# Patient Record
Sex: Male | Born: 1945 | Race: White | Hispanic: No | Marital: Single | Smoking: Former smoker
Health system: Southern US, Community
[De-identification: ages and names within clinical notes are randomized; demographics above are authoritative.]

## PROBLEM LIST (undated history)

## (undated) DIAGNOSIS — I4891 Unspecified atrial fibrillation: Secondary | ICD-10-CM

## (undated) DIAGNOSIS — G629 Polyneuropathy, unspecified: Secondary | ICD-10-CM

## (undated) DIAGNOSIS — M1990 Unspecified osteoarthritis, unspecified site: Secondary | ICD-10-CM

## (undated) DIAGNOSIS — M4800 Spinal stenosis, site unspecified: Secondary | ICD-10-CM

## (undated) DIAGNOSIS — E119 Type 2 diabetes mellitus without complications: Secondary | ICD-10-CM

## (undated) DIAGNOSIS — E669 Obesity, unspecified: Secondary | ICD-10-CM

## (undated) DIAGNOSIS — I10 Essential (primary) hypertension: Secondary | ICD-10-CM

## (undated) HISTORY — PX: BACK SURGERY: SHX140

---

## 2015-01-03 ENCOUNTER — Inpatient Hospital Stay
Admission: EM | Admit: 2015-01-03 | Discharge: 2015-01-05 | DRG: 638 | Disposition: A | Discharge status: Home / Self Care | Payer: Medicare Other | Attending: Specialist | Admitting: Specialist

## 2015-01-03 DIAGNOSIS — M4800 Spinal stenosis, site unspecified: Secondary | ICD-10-CM | POA: Diagnosis present

## 2015-01-03 DIAGNOSIS — M1990 Unspecified osteoarthritis, unspecified site: Secondary | ICD-10-CM | POA: Diagnosis present

## 2015-01-03 DIAGNOSIS — I10 Essential (primary) hypertension: Secondary | ICD-10-CM | POA: Diagnosis present

## 2015-01-03 DIAGNOSIS — E860 Dehydration: Secondary | ICD-10-CM | POA: Diagnosis present

## 2015-01-03 DIAGNOSIS — E1140 Type 2 diabetes mellitus with diabetic neuropathy, unspecified: Secondary | ICD-10-CM | POA: Diagnosis present

## 2015-01-03 DIAGNOSIS — R000 Tachycardia, unspecified: Secondary | ICD-10-CM | POA: Diagnosis present

## 2015-01-03 DIAGNOSIS — E1310 Other specified diabetes mellitus with ketoacidosis without coma: Principal | ICD-10-CM | POA: Diagnosis present

## 2015-01-03 DIAGNOSIS — E1110 Type 2 diabetes mellitus with ketoacidosis without coma: Secondary | ICD-10-CM | POA: Diagnosis present

## 2015-01-03 DIAGNOSIS — K219 Gastro-esophageal reflux disease without esophagitis: Secondary | ICD-10-CM | POA: Diagnosis present

## 2015-01-03 DIAGNOSIS — N179 Acute kidney failure, unspecified: Secondary | ICD-10-CM | POA: Diagnosis present

## 2015-01-03 DIAGNOSIS — Z79899 Other long term (current) drug therapy: Secondary | ICD-10-CM | POA: Diagnosis not present

## 2015-01-03 HISTORY — DX: Spinal stenosis, site unspecified: M48.00

## 2015-01-03 HISTORY — DX: Unspecified osteoarthritis, unspecified site: M19.90

## 2015-01-03 HISTORY — DX: Essential (primary) hypertension: I10

## 2015-01-03 HISTORY — DX: Polyneuropathy, unspecified: G62.9

## 2015-01-03 LAB — BASIC METABOLIC PANEL
Anion Gap: 21 — ABNORMAL HIGH (ref 7–16)
Anion Gap: 8 (ref 7–16)
BUN: 46 mg/dL — ABNORMAL HIGH
BUN: 35 mg/dL — ABNORMAL HIGH
CALCIUM: 8.7 mg/dL — AB
CO2: 20 mmol/L — AB
CREATININE: 1.1 mg/dL
Calcium, Total: 9.4 mg/dL
Chloride: 93 mmol/L — ABNORMAL LOW
Chloride: 111 mmol/L
Co2: 24 mmol/L
Creatinine: 1.85 mg/dL — ABNORMAL HIGH
EGFR (African American): >60
EGFR (Non-African Amer.): 37 — ABNORMAL LOW
GFR CALC AF AMER: 43 — AB
GLUCOSE: 1019 mg/dL — AB
Glucose: 314 mg/dL — ABNORMAL HIGH
POTASSIUM: 3.3 mmol/L — AB
Potassium: 4.8 mmol/L
Sodium: 134 mmol/L — ABNORMAL LOW
Sodium: 143 mmol/L

## 2015-01-03 LAB — CBC WITH DIFFERENTIAL/PLATELET
Basophil #: 0.1 10*3/uL (ref 0.0–0.1)
Basophil %: 0.3 %
EOS ABS: 0 10*3/uL (ref 0.0–0.7)
Eosinophil %: 0 %
HCT: 57.7 % — ABNORMAL HIGH (ref 40.0–52.0)
HGB: 18.3 g/dL — ABNORMAL HIGH (ref 13.0–18.0)
LYMPHS PCT: 5.3 %
Lymphocyte #: 1.4 10*3/uL (ref 1.0–3.6)
MCH: 29.4 pg (ref 26.0–34.0)
MCHC: 31.8 g/dL — AB (ref 32.0–36.0)
MCV: 93 fL (ref 80–100)
Monocyte #: 1.3 x10 3/mm — ABNORMAL HIGH (ref 0.2–1.0)
Monocyte %: 4.6 %
NEUTROS PCT: 89.8 %
Neutrophil #: 24.3 10*3/uL — ABNORMAL HIGH (ref 1.4–6.5)
Platelet: 249 10*3/uL (ref 150–440)
RBC: 6.24 10*6/uL — ABNORMAL HIGH (ref 4.40–5.90)
RDW: 14 % (ref 11.5–14.5)
WBC: 27.1 10*3/uL — AB (ref 3.8–10.6)

## 2015-01-03 LAB — URINALYSIS, COMPLETE
BACTERIA: NONE SEEN
BILIRUBIN, UR: NEGATIVE
Glucose,UR: >=500 mg/dL (ref 0–75)
Leukocyte Esterase: NEGATIVE
Nitrite: NEGATIVE
Ph: 5 (ref 4.5–8.0)
Protein: NEGATIVE
RBC,UR: NONE SEEN /HPF (ref 0–5)
Specific Gravity: 1.028 (ref 1.003–1.030)
Squamous Epithelial: NONE SEEN

## 2015-01-03 LAB — TROPONIN I: Troponin-I: 0.03 ng/mL

## 2015-01-03 LAB — MAGNESIUM: Magnesium: 2.8 mg/dL — ABNORMAL HIGH

## 2015-01-03 LAB — PHOSPHORUS: Phosphorus: 2.8 mg/dL

## 2015-01-03 LAB — POTASSIUM: POTASSIUM: 3.3 mmol/L — AB

## 2015-01-03 MED ORDER — MAGIC MOUTHWASH
10.0000 mL | Freq: Three times a day (TID) | ORAL | Status: DC
  Administered 2015-01-04 – 2015-01-05 (×3): 10 mL via ORAL
  Filled 2015-01-03 (×7): qty 10

## 2015-01-03 MED ORDER — GABAPENTIN 300 MG PO CAPS
900.0000 mg | ORAL_CAPSULE | Freq: Every day | ORAL | Status: DC
  Administered 2015-01-04: 900 mg via ORAL
  Filled 2015-01-03: qty 3

## 2015-01-03 MED ORDER — PANTOPRAZOLE SODIUM 40 MG IV SOLR
40.0000 mg | Freq: Two times a day (BID) | INTRAVENOUS | Status: DC
  Administered 2015-01-04: 40 mg via INTRAVENOUS
  Filled 2015-01-03 (×2): qty 40

## 2015-01-03 MED ORDER — SODIUM CHLORIDE 0.9 % IJ SOLN: 3.0000 mL | INTRAMUSCULAR | Status: DC | PRN

## 2015-01-03 MED ORDER — ASPIRIN EC 81 MG PO TBEC
81.0000 mg | DELAYED_RELEASE_TABLET | Freq: Every day | ORAL | Status: DC
  Administered 2015-01-04 – 2015-01-05 (×2): 81 mg via ORAL
  Filled 2015-01-03 (×2): qty 1

## 2015-01-03 MED ORDER — HEPARIN SODIUM (PORCINE) 5000 UNIT/ML IJ SOLN
5000.0000 [IU] | Freq: Three times a day (TID) | INTRAMUSCULAR | Status: DC
  Administered 2015-01-04 – 2015-01-05 (×4): 5000 [IU] via SUBCUTANEOUS
  Filled 2015-01-03 (×4): qty 1

## 2015-01-03 MED ORDER — SODIUM CHLORIDE 0.9 % IJ SOLN
3.0000 mL | Freq: Four times a day (QID) | INTRAMUSCULAR | Status: DC
  Administered 2015-01-04 – 2015-01-05 (×4): 3 mL via INTRAVENOUS

## 2015-01-03 MED ORDER — ACETAMINOPHEN 325 MG PO TABS
650.0000 mg | ORAL_TABLET | ORAL | Status: DC | PRN
Start: 2015-01-04 — End: 2015-01-05
  Administered 2015-01-04 – 2015-01-05 (×2): 650 mg via ORAL
  Filled 2015-01-03 (×2): qty 2

## 2015-01-03 MED ORDER — VITAMIN D 1000 UNITS PO TABS
1000.0000 [IU] | ORAL_TABLET | Freq: Every day | ORAL | Status: DC
  Administered 2015-01-04 – 2015-01-05 (×2): 1000 [IU] via ORAL
  Filled 2015-01-03 (×2): qty 1

## 2015-01-03 MED ORDER — CEFTRIAXONE SODIUM IN DEXTROSE 20 MG/ML IV SOLN
1.0000 g | INTRAVENOUS | Status: DC
  Administered 2015-01-04: 1 g via INTRAVENOUS
  Filled 2015-01-03 (×3): qty 50

## 2015-01-03 MED ORDER — SODIUM CHLORIDE 0.9 % IV SOLN: INTRAVENOUS | Status: DC

## 2015-01-03 MED ORDER — VITAMIN B-12 1000 MCG PO TABS
1000.0000 ug | ORAL_TABLET | Freq: Every day | ORAL | Status: DC
  Administered 2015-01-04 – 2015-01-05 (×2): 1000 ug via ORAL
  Filled 2015-01-03: qty 1

## 2015-01-03 MED ORDER — METOPROLOL TARTRATE 25 MG PO TABS
25.0000 mg | ORAL_TABLET | Freq: Two times a day (BID) | ORAL | Status: DC
  Administered 2015-01-04 (×2): 25 mg via ORAL
  Filled 2015-01-03 (×2): qty 1

## 2015-01-04 ENCOUNTER — Encounter: Payer: Self-pay | Admitting: Internal Medicine

## 2015-01-04 DIAGNOSIS — E1110 Type 2 diabetes mellitus with ketoacidosis without coma: Secondary | ICD-10-CM | POA: Diagnosis present

## 2015-01-04 DIAGNOSIS — N179 Acute kidney failure, unspecified: Secondary | ICD-10-CM | POA: Diagnosis present

## 2015-01-04 LAB — CBC
HCT: 47.4 % (ref 40.0–52.0)
Hemoglobin: 16.2 g/dL (ref 13.0–18.0)
MCH: 30.5 pg (ref 26.0–34.0)
MCHC: 34.3 g/dL (ref 32.0–36.0)
MCV: 89.1 fL (ref 80.0–100.0)
PLATELETS: 164 10*3/uL (ref 150–440)
RBC: 5.32 MIL/uL (ref 4.40–5.90)
RDW: 13.6 % (ref 11.5–14.5)
WBC: 16.8 10*3/uL — ABNORMAL HIGH (ref 3.8–10.6)

## 2015-01-04 LAB — BASIC METABOLIC PANEL
Anion Gap: 7 (ref 7–16)
BUN: 34 mg/dL — AB
CREATININE: 1.09 mg/dL
Calcium, Total: 8.5 mg/dL — ABNORMAL LOW
Chloride: 113 mmol/L — ABNORMAL HIGH
Co2: 26 mmol/L
EGFR (Non-African Amer.): >60
Glucose: 174 mg/dL — ABNORMAL HIGH
Potassium: 3.9 mmol/L
SODIUM: 146 mmol/L — AB

## 2015-01-04 LAB — GLUCOSE, CAPILLARY
GLUCOSE-CAPILLARY: 118 mg/dL — AB (ref 70–99)
GLUCOSE-CAPILLARY: 143 mg/dL — AB (ref 70–99)
GLUCOSE-CAPILLARY: 199 mg/dL — AB (ref 70–99)
GLUCOSE-CAPILLARY: 263 mg/dL — AB (ref 70–99)
GLUCOSE-CAPILLARY: 273 mg/dL — AB (ref 70–99)
Glucose-Capillary: 156 mg/dL — ABNORMAL HIGH (ref 70–99)
Glucose-Capillary: 180 mg/dL — ABNORMAL HIGH (ref 70–99)
Glucose-Capillary: 223 mg/dL — ABNORMAL HIGH (ref 70–99)
Glucose-Capillary: 261 mg/dL — ABNORMAL HIGH (ref 70–99)
Glucose-Capillary: 279 mg/dL — ABNORMAL HIGH (ref 70–99)
Glucose-Capillary: 248 mg/dL — ABNORMAL HIGH (ref 70–99)
Glucose-Capillary: 195 mg/dL — ABNORMAL HIGH (ref 70–99)
Glucose-Capillary: 237 mg/dL — ABNORMAL HIGH (ref 70–99)
Glucose-Capillary: 141 mg/dL — ABNORMAL HIGH (ref 70–99)
Glucose-Capillary: 313 mg/dL — ABNORMAL HIGH (ref 70–99)
Glucose-Capillary: 223 mg/dL — ABNORMAL HIGH (ref 70–99)
Glucose-Capillary: 220 mg/dL — ABNORMAL HIGH (ref 70–99)
Glucose-Capillary: 184 mg/dL — ABNORMAL HIGH (ref 70–99)

## 2015-01-04 LAB — COMPREHENSIVE METABOLIC PANEL
ALK PHOS: 77 U/L (ref 38–126)
ALT: 25 U/L (ref 17–63)
AST: 17 U/L (ref 15–41)
Albumin: 3.2 g/dL — ABNORMAL LOW (ref 3.5–5.0)
Anion gap: 7 (ref 5–15)
BILIRUBIN TOTAL: 0.7 mg/dL (ref 0.3–1.2)
BUN: 32 mg/dL — ABNORMAL HIGH (ref 6–20)
CO2: 24 mmol/L (ref 22–32)
CREATININE: 0.95 mg/dL (ref 0.61–1.24)
Calcium: 8.2 mg/dL — ABNORMAL LOW (ref 8.9–10.3)
Chloride: 114 mmol/L — ABNORMAL HIGH (ref 101–111)
GFR calc Af Amer: >60 mL/min (ref >60)
Glucose, Bld: 281 mg/dL — ABNORMAL HIGH (ref 65–99)
POTASSIUM: 3.9 mmol/L (ref 3.5–5.1)
SODIUM: 145 mmol/L (ref 135–145)
Total Protein: 5.9 g/dL — ABNORMAL LOW (ref 6.5–8.1)

## 2015-01-04 LAB — LIPID PANEL
CHOL/HDL RATIO: 7.1 ratio
Cholesterol: 178 mg/dL (ref 0–200)
HDL: 25 mg/dL — AB (ref >40)
LDL Cholesterol: 77 mg/dL (ref 0–99)
Triglycerides: 378 mg/dL — ABNORMAL HIGH (ref <150)
VLDL: 76 mg/dL — ABNORMAL HIGH (ref 0–40)

## 2015-01-04 LAB — HEMOGLOBIN A1C: Hemoglobin A1C: 10.8 % — ABNORMAL HIGH

## 2015-01-04 LAB — URINE CULTURE

## 2015-01-04 MED ORDER — INSULIN REGULAR HUMAN 100 UNIT/ML IJ SOLN
INTRAMUSCULAR | Status: DC
  Administered 2015-01-04: 4 [IU]/h via INTRAVENOUS
  Filled 2015-01-04: qty 2.5

## 2015-01-04 MED ORDER — INSULIN ASPART 100 UNIT/ML ~~LOC~~ SOLN
10.0000 [IU] | Freq: Three times a day (TID) | SUBCUTANEOUS | Status: DC
  Administered 2015-01-04 – 2015-01-05 (×5): 10 [IU] via SUBCUTANEOUS
  Filled 2015-01-04 (×5): qty 10

## 2015-01-04 MED ORDER — SODIUM CHLORIDE 0.9 % IV SOLN
INTRAVENOUS | Status: DC
  Administered 2015-01-04 – 2015-01-05 (×4): via INTRAVENOUS

## 2015-01-04 MED ORDER — DEXTROSE-NACL 5-0.9 % IV SOLN
INTRAVENOUS | Status: DC
  Administered 2015-01-04 (×2): via INTRAVENOUS

## 2015-01-04 MED ORDER — LIVING WELL WITH DIABETES BOOK
Freq: Once | Status: AC
  Administered 2015-01-04: 12:00:00
  Filled 2015-01-04: qty 1

## 2015-01-04 MED ORDER — INSULIN DETEMIR 100 UNIT/ML ~~LOC~~ SOLN
25.0000 [IU] | Freq: Every day | SUBCUTANEOUS | Status: DC
  Filled 2015-01-04: qty 0.25

## 2015-01-04 MED ORDER — INSULIN ASPART 100 UNIT/ML ~~LOC~~ SOLN
0.0000 [IU] | SUBCUTANEOUS | Status: DC
  Administered 2015-01-04: 2 [IU] via SUBCUTANEOUS
  Administered 2015-01-04: 6 [IU] via SUBCUTANEOUS
  Administered 2015-01-04: 8 [IU] via SUBCUTANEOUS
  Administered 2015-01-05: 2 [IU] via SUBCUTANEOUS
  Administered 2015-01-05: 4 [IU] via SUBCUTANEOUS
  Administered 2015-01-05: 2 [IU] via SUBCUTANEOUS
  Filled 2015-01-04: qty 8
  Filled 2015-01-04: qty 4
  Filled 2015-01-04: qty 2
  Filled 2015-01-04: qty 6
  Filled 2015-01-04 (×2): qty 2

## 2015-01-04 MED ORDER — INSULIN DETEMIR 100 UNIT/ML ~~LOC~~ SOLN
25.0000 [IU] | SUBCUTANEOUS | Status: AC
  Administered 2015-01-04: 25 [IU] via SUBCUTANEOUS
  Filled 2015-01-04: qty 0.25

## 2015-01-04 MED ORDER — PANTOPRAZOLE SODIUM 40 MG PO TBEC
40.0000 mg | DELAYED_RELEASE_TABLET | Freq: Two times a day (BID) | ORAL | Status: DC
  Administered 2015-01-04 – 2015-01-05 (×2): 40 mg via ORAL
  Filled 2015-01-04 (×2): qty 1

## 2015-01-04 NOTE — Plan of Care (Signed)
Problem: Consults Goal: Diagnosis-Diabetes Mellitus Outcome: Adequate for Discharge New Onset Type II

## 2015-01-04 NOTE — Progress Notes (Signed)
Initial Nutrition Assessment  DOCUMENTATION CODES:     INTERVENTION:  Nutrition related diet education: Discussed carb counting, meal planning and label reading with pt (sleeping) and significant other at bedside.  Significant other verbalized understanding, expect fair compliance.  NUTRITION DIAGNOSIS:  Food and nutrition related knowledge deficit related to new diagnosis of DM as evidenced by consult for diet education.    GOAL:  Patient will meet greater than or equal to 90% of their needs    MONITOR:  Energy intake Labs  REASON FOR ASSESSMENT:  Consult Diet education  ASSESSMENT:  Pt admitted with DKA, ARF, new diagnosis of DM PMhx: spinal stenosis, HTN, osteoarthritis, GERD  Height:  Ht Readings from Last 1 Encounters:  01/03/15 6' 3.98" (1.93 m)    Weight:  Wt Readings from Last 1 Encounters:  01/03/15 260 lb (117.935 kg)    Ideal Body Weight:     Wt Readings from Last 10 Encounters:  01/03/15 260 lb (117.935 kg)    BMI:  Body mass index is 31.66 kg/(m^2).  Estimated Nutritional Needs:  Kcal:     Protein:     Fluid:     Skin:     Diet Order:  Diet Carb Modified Fluid consistency:: Thin; Room service appropriate?: Yes  EDUCATION NEEDS:  Education needs addressed   Intake/Output Summary (Last 24 hours) at 01/04/15 1309 Last data filed at 01/04/15 0800  Gross per 24 hour  Intake  946.6 ml  Output    300 ml  Net  646.6 ml    Last BM:  4/30  Low care level

## 2015-01-04 NOTE — Progress Notes (Signed)
Transfer from CCU.  Vitals remained stable this shift.  Pt had no complaints of pain.  Patients CBG are trending down.

## 2015-01-04 NOTE — Progress Notes (Signed)
Patient is receiving Protonix by the IV route.  Pt meets the P & T approved criteria for changing to oral administration.  - No GI bleeding  - Tolerating an oral or per tube diet  - Taking other oral or per tube medications.  Will change patient to Protonix 40mg  PO BID per P&T policy.  Thank you. Toys 'R' UsKimberly Finnian Husted, Pharm.D., BCPS Clinical Pharmacist Pager [phone]

## 2015-01-04 NOTE — Progress Notes (Signed)
Patient is alert and oriented. Reporting no pain. Converted from insulin drip this am, tolerating subq insulin injections and tolerating diet. Room air with oxygen saturation WNL. Voiding in urinal. Transfer to room 136. Report given to [place]. Transporting at this time.

## 2015-01-04 NOTE — Progress Notes (Signed)
Patient Demographics  Steven BeaversRobert Gallegos, is a 69 y.o. male, DOB - May 12, 1946, NFA:[id number]  Admit date - 01/03/2015   Admitting Physician Marguarite ArbourJeffrey D Sparks, MD  Outpatient Primary MD for the patient is No primary care provider on file.        Subjective:   Steven Beaversobert Moorer today has No chest pain, No abdominal pain, No Nausea/vomiting, No Cough     CONSTITUTIONAL: No documented fever. No fatigue, weakness. No weight gain, no weight loss.  ENT: No tinnitus. No postnasal drip. No redness of the oropharynx.  RESPIRATORY: No cough, no wheeze, no hemoptysis. No dyspnea.  CARDIOVASCULAR: No chest pain. No orthopnea. No palpitations. No syncope.  GASTROINTESTINAL: No nausea, no vomiting or diarrhea. No abdominal pain. No melena or hematochezia.  GENITOURINARY: No dysuria or hematuria.  ENDOCRINE: No polyuria or nocturia. No heat or cold intolerance.  MUSCULOSKELETAL: No arthritis. No swelling. No gout.  NEUROLOGIC: No numbness, tingling, or ataxia. No seizure-type activity.  PSYCHIATRIC: No anxiety. No insomnia. No ADD.      Objective:   Filed Vitals:   01/04/15 0540 01/04/15 0600 01/04/15 0700 01/04/15 0800  BP: 126/98 130/92 145/89 135/91  Pulse: 89 89 87 87  Temp:      TempSrc:      Resp: 21 21 21 19   Height:      Weight:      SpO2: 99% 97% 97% 95%    Wt Readings from Last 3 Encounters:  01/03/15 117.935 kg (260 lb)     Intake/Output Summary (Last 24 hours) at 01/04/15 0941 Last data filed at 01/04/15 0800  Gross per 24 hour  Intake  946.6 ml  Output    300 ml  Net  646.6 ml      Physical Exam   Gen - Awake Alert, Oriented X 3 HEENT - AT, [place],PERRL, moist oral mucosa.   Neck - Supple Neck,No JVD, No Lymphadenopathy, no thyromegaly.  Trachea midline.  Lung - CTA b/l, no rales, rhonchi, wheezes.   Heart - RRR,No Gallops,Rubs or new Murmurs, No Parasternal Heave Abg - +ve B.Sounds, Abd Soft, No tenderness, No organomegaly appriciated, No rebound - guarding or  rigidity. Ext - No Cyanosis, Clubbing or peripheral edema, No new Rashes or bruising.   Neuro - AAO X 3, no focal motor or sensory defecits b/l  Data Review   Micro Results No results found for this or any previous visit (from the past 240 hour(s)).  Radiology Reports Dg Chest Port 1 View  01/03/2015   CLINICAL DATA:  Several day history of weakness and dizziness  EXAM: PORTABLE CHEST - 1 VIEW  COMPARISON:  None.  FINDINGS: Lungs are clear. Heart size and pulmonary vascularity are normal. No adenopathy. No bone lesions.  IMPRESSION: No edema or consolidation.   Electronically Signed   By: Bretta BangWilliam  Woodruff III M.D.   On: 01/03/2015 13:57    CBC  Recent Labs Lab 01/03/15 1005 01/04/15 0404  WBC 27.1* 16.8*  HGB 18.3* 16.2  HCT 57.7* 47.4  PLT 249 164  MCV 93 89.1  MCH 29.4 30.5  MCHC 31.8* 34.3  RDW 14.0 13.6  LYMPHSABS 1.4  --   MONOABS 1.3*  --   EOSABS 0.0  --   BASOSABS 0.1  --     Chemistries   Recent Labs Lab 01/03/15 1005 01/03/15 1926 01/03/15 2354 01/04/15 0404  NA  --   --   --  145  K  --   --   --  3.9  CL  --   --   --  114*  CO2 20* GLUCOSE  --   --   --  281*  BUN 46* 35* 34* 32*  CREATININE 1.85* 1.10 1.09 0.95  CALCIUM 9.4 8.7* 8.5* 8.2*  AST  --   --   --  17  ALT  --   --   --  25  ALKPHOS  --   --   --  77  BILITOT  --   --   --  0.7   ------------------------------------------------------------------------------------------------------------------ estimated creatinine clearance is 105.9 mL/min (by C-G formula based on Cr of 0.95). ------------------------------------------------------------------------------------------------------------------ No results for input(s): HGBA1C in the last 72 hours. ------------------------------------------------------------------------------------------------------------------ No results for input(s): CHOL, HDL, LDLCALC, TRIG, CHOLHDL, LDLDIRECT in the last 72  hours. ------------------------------------------------------------------------------------------------------------------ No results for input(s): TSH, T4TOTAL, T3FREE, THYROIDAB in the last 72 hours.  Invalid input(s): FREET3 ------------------------------------------------------------------------------------------------------------------ No results for input(s): VITAMINB12, FOLATE, FERRITIN, TIBC, IRON, RETICCTPCT in the last 72 hours.  Coagulation profile No results for input(s): INR, PROTIME in the last 168 hours.  No results for input(s): DDIMER in the last 72 hours.  Cardiac Enzymes No results for input(s): CKMB, TROPONINI, MYOGLOBIN in the last 168 hours.  Invalid input(s): CK ------------------------------------------------------------------------------------------------------------------ Invalid input(s): POCBNP       Assessment & Plan   Active Problems:    1. Diabetic ketoacidosis - resolved now and BS stable and AG closed.  - off insulin gtt and started on Levemir, Novolog w/ meals and will monitor.  - HemoglobinA1c is pending.   - No N/V and tolerating PO well.   2. Acute renal failure - likely due to dehydration and hyperglycemia.  - improved and resolved as BS have corrected.   3. Tachycardia - likely due to dehydration, DKA.  - resolved now as DKA has resolved.   4. Leukocytosis - likely stress mediated from DKA - no evidence of infection. WBC count trending donw.  - cont. Empiric Ceftriaxone and will d/c in a.m. If WBC count stable.   5. DM Neuropathy - cont. Cont. Neurontin.   6. Htn - cont. Metoprolol  7. GERD - cont. Protonix.       Medications  Scheduled Meds: . aspirin EC  81 mg Oral Daily  . cefTRIAXone (ROCEPHIN)  IV  1 g Intravenous Q24H  . cholecalciferol  1,000 Units Oral Daily  . gabapentin  900 mg Oral QHS  . heparin subcutaneous  5,000 Units Subcutaneous 3 times per day  . insulin aspart  0-24 Units Subcutaneous 6 times per day   . insulin aspart  10 Units Subcutaneous TID WC  . insulin detemir  25 Units Subcutaneous Every 24 Hours  . magic mouthwash  10 mL Oral TID  . metoprolol tartrate  25 mg Oral BID  . pantoprazole (PROTONIX) IV  40 mg Intravenous Q12H  . sodium chloride  3 mL Intravenous Q6H  . vitamin B-12  1,000 mcg Oral Daily   Continuous Infusions: . dextrose 5 % and 0.9% NaCl 150 mL/hr at 01/04/15 0800  . insulin (NOVOLIN-R) infusion 4 Units/hr (01/04/15 0209)   PRN Meds:.acetaminophen, sodium chloride   DVT Prophylaxis  Heparin SQ   Code Status: Full Code  Family Communication: Discussed plan with patient.   Disposition Plan: Likely D/c home in next 24 hrs is Blood sugars stable.    Time Spent in minutes   30   Houston Siren M.D on 01/04/2015 at 9:41 AM  [facility] Office  [phone]

## 2015-01-05 LAB — CBC
HCT: 44.7 % (ref 40.0–52.0)
HEMOGLOBIN: 14.8 g/dL (ref 13.0–18.0)
MCH: 29.8 pg (ref 26.0–34.0)
MCHC: 33.1 g/dL (ref 32.0–36.0)
MCV: 89.9 fL (ref 80.0–100.0)
Platelets: 141 10*3/uL — ABNORMAL LOW (ref 150–440)
RBC: 4.97 MIL/uL (ref 4.40–5.90)
RDW: 13.7 % (ref 11.5–14.5)
WBC: 11.5 10*3/uL — ABNORMAL HIGH (ref 3.8–10.6)

## 2015-01-05 LAB — GLUCOSE, CAPILLARY
GLUCOSE-CAPILLARY: 190 mg/dL — AB (ref 70–99)
Glucose-Capillary: 144 mg/dL — ABNORMAL HIGH (ref 70–99)
Glucose-Capillary: 162 mg/dL — ABNORMAL HIGH (ref 70–99)
Glucose-Capillary: 205 mg/dL — ABNORMAL HIGH (ref 70–99)

## 2015-01-05 LAB — CULTURE, BLOOD (SINGLE)

## 2015-01-05 MED ORDER — INSULIN STARTER KIT- SYRINGES (ENGLISH)
1.0000 | Freq: Once | Status: AC
  Administered 2015-01-05: 1
  Filled 2015-01-05: qty 1

## 2015-01-05 MED ORDER — INSULIN DETEMIR 100 UNIT/ML ~~LOC~~ SOLN: 25.0000 [IU] | Freq: Every day | SUBCUTANEOUS | Status: DC

## 2015-01-05 MED ORDER — ASPIRIN 81 MG PO TBEC: 81.0000 mg | DELAYED_RELEASE_TABLET | Freq: Every day | ORAL | Status: AC

## 2015-01-05 NOTE — Discharge Summary (Signed)
Steven Gallegos, 69 y.o., DOB 23-Dec-1945, MRN [id number]. Admission date: 01/03/2015 Discharge Date 01/05/2015 Primary MD No primary care provider on file. Admitting Physician Marguarite Arbour, MD  Admission Diagnosis  hyperglycemia  Discharge Diagnosis   Active Problems: DKA New onset DM   Tachycardia Acute renal failure      Past Medical History  Diagnosis Date  . Peripheral neuropathy   . Spinal stenosis   . Hypertension   . Osteoarthritis     No past surgical history on file.    Hospital Course  The patient is a 69 year old male followed at the [facility] with a significant history of spinal stenosis, peripheral neuropathy and hypertension. Presents to the emergency room with a 3 to 4 day history of weakness and dizziness. He has had polyuria and polydipsia. He has had weight loss. In the emergency room, the patient was noted to have blood sugars greater than 1000 with an elevated anion gap consistent with DKA. No history of diabetes. Pt was admitted to ICU and was started on insulin drip, his bg improved he was subsequently switched over to Levemir, Pt bg now improved , he has been seen by dietary to help with diet.  Also nurse has taught him how to check his bg and admister insulin.  He also was noted to have ARF resolved with ivf.      Consults  : Case manager, dietary, diabetic teaching.  Significant Tests:  See full reports for all details    Dg Chest Port 1 View  01/03/2015   CLINICAL DATA:  Several day history of weakness and dizziness  EXAM: PORTABLE CHEST - 1 VIEW  COMPARISON:  None.  FINDINGS: Lungs are clear. Heart size and pulmonary vascularity are normal. No adenopathy. No bone lesions.  IMPRESSION: No edema or consolidation.   Electronically Signed   By: Bretta Bang III M.D.   On: 01/03/2015 13:57       Today   Subjective:   [name] today has no headache,no chest abdominal pain,no new weakness tingling or numbness, feels much better wants to  go home today.   Objective:   Blood pressure 114/79, pulse 89, temperature 98.2 F (36.8 C), temperature source Oral, resp. rate 18, height 6' 3.98" (1.93 m), weight 117.935 kg (260 lb), SpO2 96 %.  .  Intake/Output Summary (Last 24 hours) at 01/05/15 1027 Last data filed at 01/05/15 0800  Gross per 24 hour  Intake 2591.5 ml  Output    700 ml  Net 1891.5 ml    Exam Awake Alert, Oriented *3, No new F.N deficits, Normal affect [place].AT,PERRAL Supple Neck,No JVD, No cervical lymphadenopathy appreciated.  Symmetrical Chest wall movement, Good air movement bilaterally, CTAB RRR,No Gallops,Rubs or new Murmurs, No Parasternal Heave +ve B.Sounds, Abd Soft, Non tender, No organomegaly appriciated, No rebound -guarding or rigidity. No Cyanosis, Clubbing or edema, No new Rash or bruise  Data Review   Cultures -    CBC w Diff: Lab Results  Component Value Date   WBC 11.5* 01/05/2015   WBC 27.1* 01/03/2015   HGB 14.8 01/05/2015   HGB 18.3* 01/03/2015   HCT 44.7 01/05/2015   HCT 57.7* 01/03/2015   PLT 141* 01/05/2015   PLT 249 01/03/2015   LYMPHOPCT 5.3 01/03/2015   MONOPCT 4.6 01/03/2015   EOSPCT 0.0 01/03/2015   BASOPCT 0.3 01/03/2015   CMP: Lab Results  Component Value Date   NA 145 01/04/2015   NA 146* 01/03/2015   K 3.9  01/04/2015   K 3.9 01/03/2015   CL 114* 01/04/2015   CL 113* 01/03/2015   CO2 24 01/04/2015   CO2 26 01/03/2015   BUN 32* 01/04/2015   BUN 34* 01/03/2015   CREATININE 0.95 01/04/2015   CREATININE 1.09 01/03/2015   PROT 5.9* 01/04/2015   ALBUMIN 3.2* 01/04/2015   BILITOT 0.7 01/04/2015   ALKPHOS 77 01/04/2015   AST 17 01/04/2015   ALT 25 01/04/2015  .  Micro Results Recent Results (from the past 240 hour(s))  Urine culture     Status: None   Collection Time: 01/03/15 10:05 AM  Result Value Ref Range Status   Micro Text Report   Final       SOURCE: CLEAN CATCH    COMMENT                   MIXED BACTERIAL ORGANISMS   COMMENT                    RESULTS SUGGESTIVE OF CONTAMINATION   ANTIBIOTIC                                                      Culture, blood (single)     Status: None (Preliminary result)   Collection Time: 01/03/15  1:16 PM  Result Value Ref Range Status   Micro Text Report   Preliminary       SOURCE: #2 right wrist    COMMENT                   NO GROWTH IN 18-24 HOURS   ANTIBIOTIC                                                      Culture, blood (single)     Status: None (Preliminary result)   Collection Time: 01/03/15  1:22 PM  Result Value Ref Range Status   Micro Text Report   Preliminary       SOURCE: #1 left hand    COMMENT                   NO GROWTH IN 18-24 HOURS   ANTIBIOTIC                                                         Discharge Instructions      Follow-up Information    [facility]. Schedule an appointment as soon as possible for a visit in 1 week.   Contact information:    [address], [place], [place] [address]   [phone]      Follow up with Cleta AlbertsMULLES, CORAZON, MD On 01/06/2015.   Specialty:  Internal Medicine   Why:  Appointment is at 11:30   Contact information:   [address] [place] [address] [phone]       Discharge Medications     Medication List    STOP taking these  medications        hydrochlorothiazide 25 MG tablet  Commonly known as:  HYDRODIURIL      TAKE these medications        albuterol-ipratropium 18-103 MCG/ACT inhaler  Commonly known as:  COMBIVENT  Inhale 1 puff into the lungs every 6 (six) hours as needed for wheezing or shortness of breath.     aspirin 81 MG EC tablet  Take 1 tablet (81 mg total) by mouth daily.     cyanocobalamin 500 MCG tablet  Take 1,000 mcg by mouth daily.     gabapentin 300 MG capsule  Commonly known as:  NEURONTIN  Take 900 mg by mouth at bedtime.     insulin detemir 100 UNIT/ML injection  Commonly known as:  LEVEMIR   Inject 0.25 mLs (25 Units total) into the skin at bedtime.     omeprazole 20 MG capsule  Commonly known as:  PRILOSEC  Take 20 mg by mouth daily.     ONE-A-DAY MENS HEALTH FORMULA PO  Take 1 tablet by mouth daily.     Vitamin D2 2000 UNITS Tabs  Take 1 tablet by mouth daily.         Total Time in preparing paper work, data evaluation and todays exam - 35 minutes  Auburn Bilberry M.D on 01/05/2015 at 10:27 AM  [facility]   Office  [phone]

## 2015-01-05 NOTE — H&P (Signed)
PATIENT NAME:  Steven Gallegos, Steven Gallegos MR#:  [id number] DATE OF BIRTH:  1946/01/14  DATE OF ADMISSION:  01/03/2015  REFERRING PHYSICIAN: Sheryl L. Mindi JunkerGottlieb, MD.   [facility]   REASON FOR ADMISSION: New onset diabetes with ketoacidosis.   HISTORY OF PRESENT ILLNESS: The patient is a 69 year old male [facility] with a significant history of spinal stenosis, peripheral neuropathy and hypertension. Presents to the emergency room with a 3 to 4 day history of weakness and dizziness. He has had polyuria and polydipsia. He has had weight loss. In the emergency room, the patient was noted to have blood sugars greater than 1000 with an elevated anion gap consistent with DKA. No history of diabetes. He is now admitted for further evaluation.   PAST MEDICAL HISTORY: 1. Degenerative disk disease with spinal stenosis.  2. Peripheral neuropathy.  3. Osteoarthritis.  4. Benign hypertension.   MEDICATIONS: 1. Gabapentin 900 mg p.o. at bedtime.  2. Omeprazole 20 mg p.o. daily.  3. Hydrochlorothiazide 25 mg p.o. daily.   ALLERGIES: No known drug allergies.   SOCIAL HISTORY: The patient quit smoking 2 years ago. Quit alcohol 12 years ago.   FAMILY HISTORY: Positive for hypertension, but negative for diabetes.   REVIEW OF SYSTEMS:  CONSTITUTIONAL: No fever, but has had weight loss.  EYES: No blurred or double vision. No glaucoma.  ENT: No tinnitus or hearing loss. No nasal discharge or bleeding. No difficulty swallowing.  RESPIRATORY: No cough or wheezing. Denies hemoptysis.  CARDIOVASCULAR: No chest pain or orthopnea. No palpitations or syncope.  GASTROINTESTINAL: Some nausea, but no vomiting or diarrhea. No abdominal pain. No change in bowel habits.  GENITOURINARY: No dysuria or hematuria. No incontinence.  ENDOCRINE: The patient has had polyuria. Denies heat or cold intolerance.  HEMATOLOGIC: The patient denies anemia, easy bruising or bleeding.  LYMPHATIC: No swollen  glands.  MUSCULOSKELETAL: The patient denies pain in his neck, back, shoulders, knees or hips. No gout.  NEUROLOGIC: No migraines, stroke or seizures.  PSYCHOLOGICAL: The patient denies anxiety, insomnia or depression.   PHYSICAL EXAMINATION: GENERAL: The patient is in no acute distress.  VITAL SIGNS: Currently remarkable for a blood pressure of 147/98, with a heart rate 103, respiratory rate of 20, temperature 97.5, saturation 94% on room air.  HEENT: Normocephalic, atraumatic. Pupils equally round and reactive to light and accommodation. Extraocular movements are intact. Sclerae are anicteric. Conjunctivae are clear. Oropharynx dry, but clear.  NECK: Supple without JVD. No adenopathy or thyromegaly is noted.  LUNGS: Clear to auscultation and percussion without wheezes, rales or rhonchi. No dullness. Respiratory effort is normal.  CARDIAC: Rapid rate with a regular rhythm. Normal S1 and S2. No significant rubs or gallops. PMI is nondisplaced. Chest wall is nontender.  ABDOMEN: Soft, nontender with normoactive bowel sounds. No organomegaly or masses appreciated. No hernias or bruits were noted.  EXTREMITIES: Without clubbing, cyanosis or edema. Pulses were 2+ bilaterally.  SKIN: Warm and dry without rash or lesions.  NEUROLOGIC: Cranial nerves II through XII grossly intact. Deep tendon reflexes were symmetric. Motor and sensory examination is nonfocal.  PSYCHIATRIC: Revealed a patient who is alert and oriented to person, place and time. He was cooperative and used good judgment.   LABORATORY DATA: EKG revealed sinus tachycardia with no acute ischemic changes. Glucose was 1019 with a BUN of 46, creatinine 1.85 with a sodium of 134 and a potassium of 4.8. His GFR was 37. Troponin was 0.03. His white count was 27.1  with a hemoglobin of 18.3.   ASSESSMENT: 1. New-onset diabetes with ketoacidosis.  2. Dehydration.  3. Acute renal insufficiency.  4. Tachycardia.  5. Benign hypertension.   6. Polycythemia.   PLAN: The patient will be admitted to the Intensive Care Unit with IV fluids and an insulin drip. We will follow his sugars hourly and wean the drip as tolerated. We will hold his hydrochlorothiazide. Add Lopressor for hypertension control. Cultures have been sent. We will begin empiric IV antibiotics. Continue gabapentin for neuropathy. An 1800 calorie 2 grams sodium diet for now. Follow up routine labs in the morning. We will obtain a baseline chest x-ray. Further treatment and evaluation will depend upon the patient's progress.   TOTAL TIME SPENT ON THIS PATIENT: 50 minutes.     ____________________________ Duane Lope Judithann Sheen, MD jds:TT D: 01/03/2015 13:27:04 ET T: 01/03/2015 14:30:35 ET JOB#: [id number]  cc: Duane Lope. Judithann Sheen, MD, <Dictator> Bridgette Wolden Rodena Medin MD ELECTRONICALLY SIGNED 01/03/2015 15:18

## 2015-01-05 NOTE — Discharge Instructions (Signed)
What to do after you leave the hospital:  Recommended diet: diabetic diet, low fat low cholerstrol diet, 2g na diet  Recommended activity: activity as tolerated  [facility]   [facility]  Other instructions:  Check BG prior to each meal, keep log to take to primary md If experience bg less then 70 let primary md know

## 2015-01-05 NOTE — Progress Notes (Signed)
Discharge instructions given per MD order, home and new medications reviewed with patient and Fiancee. Rx.slips given for levemir and diabetic supplies for home. Discharge via wheelchair with axillary team member. Mehmet Scally J Mohamed Portlock 1:54 PM

## 2015-01-05 NOTE — Care Management Note (Signed)
Case Management Note  Patient Details  Name: Steven Gallegos MRN: [id number] Date of Birth: 1946/04/28  Subjective/Objective:                    Action/Plan:   Expected Discharge Date:  01/05/15               Expected Discharge Plan:  Home/Self Care  In-House Referral:     Discharge planning Services  CM Consult  Post Acute Care Choice:  NA Choice offered to:  NA  DME Arranged:  N/A DME Agency:   N/A  HH Arranged:   N/A HH Agency:   N/A  Status of Service:     Medicare Important Message Given:  Yes Date Medicare IM Given:  01/05/15 Medicare IM give by:  Marshell Garfinkel Date Additional Medicare IM Given:    Additional Medicare Important Message give by:     If discussed at [place] of Stay Meetings, dates discussed:    Additional Comments: Met with patient to discuss discharge planning. He plans to return home today with his girlfriend [name]. He is new diabetic so he is pending diabetes nurse consult. He has a walker and cane available to ambulate with in the home. His PCP and pharmacy is with [facility] [phone].  Marshell Garfinkel, RN 01/05/2015, 8:46 AM

## 2015-01-05 NOTE — Progress Notes (Signed)
Patient to go home today with insulin- new onset diabetic. Patient self administer morning insulin today. Will continue to monitor.  8:22 AM Steven Gallegos

## 2015-01-05 NOTE — Progress Notes (Signed)
Inpatient Diabetes Program Recommendations  AACE/ADA: New Consensus Statement on Inpatient Glycemic Control (2013)  Target Ranges:  Prepandial:   less than 140 mg/dL      Peak postprandial:   less than 180 mg/dL (1-2 hours)      Critically ill patients:  140 - 180 mg/dL   Reason for Visit:  Referral received due to patient having new onset diabetes.  Results for Steven Gallegos, Steven Gallegos (MRN [id number]) as of 01/05/2015 11:15  Ref. Range 01/03/2015 19:26  Hemoglobin A1C Latest Units: % 10.8 (H)   Spoke at length with patient and girlfriend.  They state that he was recently given steroids due to bulging disks in his back after an acute episode of pain in [facility].  He had completed the course of steroids prior to admission but glucose was greater than 1000 on admit.  Note that A1C indicates elevated CBG's for the past 3 months.  Discussed results with patient.  He states that his brother had diabetes so he is somewhat knowledgeable.  Discussed hyperglycemia and hypoglycemia symptoms and treatment. He was able to "teach back"  Proper treatment of hypoglycemia using 15/15 rule.  I also reviewed insulin pen with patient.  He has been ordered vial/syringe which may be better covered by [facility].  Told him to discuss with his PCP at [facility] the options of insulin pen.  Bedside RN has already taught insulin administration with insulin syringe and he practiced this morning.  Ordered insulin starter kit for home which has handouts and extra syringes. He also is familiar with checking CBG's and has rx. For meter. He is interested in attending Outpatient Diabetes Education classes.  Explained Levemir insulin as a basal insulin to patient also.  No further needs noted at this time.    Thanks, Adah Perl, RN, BC-ADM Inpatient Diabetes Coordinator Pager [phone] (8a-5p)  According to d/c instructions, he has appointment with PCP on 01/06/15.

## 2015-01-05 NOTE — Progress Notes (Signed)
Patient Demographics  Steven Gallegos, is a 69 y.o. male, DOB - [birth year], ZOX:[id number]  Admit date - 01/03/2015   Admitting Physician Marguarite Arbour, MD  Outpatient Primary MD for the patient is No primary care provider on file.        Subjective:   Steven Gallegos   Feels much better, no nausea or vomting, bg improved, denies any cp or sob   CONSTITUTIONAL: No documented fever. No fatigue, weakness. No weight gain, no weight loss.  ENT: No tinnitus. No postnasal drip. No redness of the oropharynx.  RESPIRATORY: No cough, no wheeze, no hemoptysis. No dyspnea.  CARDIOVASCULAR: No chest pain. No orthopnea. No palpitations. No syncope.  GASTROINTESTINAL: No nausea, no vomiting or diarrhea. No abdominal pain. No melena or hematochezia.  GENITOURINARY: No dysuria or hematuria.  ENDOCRINE: No polyuria or nocturia. No heat or cold intolerance.  MUSCULOSKELETAL: No arthritis. No swelling. No gout.  NEUROLOGIC: No numbness, tingling, or ataxia. No seizure-type activity.  PSYCHIATRIC: No anxiety. No insomnia. No ADD.      Objective:   Filed Vitals:   01/04/15 2001 01/04/15 2350 01/05/15 0439 01/05/15 0727  BP: 121/71 121/81 143/71 93/70  Pulse: 85 93 89 41  Temp: 98.2 F (36.8 C) 98.1 F (36.7 C) 98.1 F (36.7 C) 97.9 F (36.6 C)  TempSrc: Oral Oral Oral Oral  Resp: Height:      Weight:      SpO2: 98% 100% 95% 98%    Wt Readings from Last 3 Encounters:  01/03/15 117.935 kg (260 lb)     Intake/Output Summary (Last 24 hours) at 01/05/15 0803 Last data filed at 01/05/15 0753  Gross per 24 hour  Intake 2351.5 ml  Output    700 ml  Net 1651.5 ml      Physical Exam   Gen - Awake Alert, Oriented X 3 HEENT - AT, [place],PERRL, moist oral mucosa.   Neck - Supple Neck,No JVD, No Lymphadenopathy, no thyromegaly.  Trachea midline.  Lung - CTA b/l, no rales, rhonchi, wheezes.   Heart - RRR,No Gallops,Rubs or new Murmurs, No Parasternal Heave ABD - +ve B.Sounds,  Abd Soft, No tenderness, No organomegaly appriciated, No rebound - guarding or rigidity. Ext - No Cyanosis, Clubbing or peripheral edema, No new Rashes or bruising.   Neuro - AAO X 3, no focal motor or sensory defecits b/l Skin - no rash, no erythema LN- non palpable Pych- not anxious or depressed Vascular- good dp /pt  Data Review   Micro Results Recent Results (from the past 240 hour(s))  Urine culture     Status: None   Collection Time: 01/03/15 10:05 AM  Result Value Ref Range Status   Micro Text Report   Final       SOURCE: CLEAN CATCH    COMMENT                   MIXED BACTERIAL ORGANISMS   COMMENT                   RESULTS SUGGESTIVE OF CONTAMINATION   ANTIBIOTIC                                                      Culture, blood (single)     Status: None (Preliminary result)  Collection Time: 01/03/15  1:16 PM  Result Value Ref Range Status   Micro Text Report   Preliminary       SOURCE: #2 right wrist    COMMENT                   NO GROWTH IN 18-24 HOURS   ANTIBIOTIC                                                      Culture, blood (single)     Status: None (Preliminary result)   Collection Time: 01/03/15  1:22 PM  Result Value Ref Range Status   Micro Text Report   Preliminary       SOURCE: #1 left hand    COMMENT                   NO GROWTH IN 18-24 HOURS   ANTIBIOTIC                                                        Radiology Reports Dg Chest Port 1 View  01/03/2015   CLINICAL DATA:  Several day history of weakness and dizziness  EXAM: PORTABLE CHEST - 1 VIEW  COMPARISON:  None.  FINDINGS: Lungs are clear. Heart size and pulmonary vascularity are normal. No adenopathy. No bone lesions.  IMPRESSION: No edema or consolidation.   Electronically Signed   By: Bretta BangWilliam  Woodruff III M.D.   On: 01/03/2015 13:57    CBC  Recent Labs Lab 01/03/15 1005 01/04/15 0404 01/05/15 0408  WBC 27.1* 16.8* 11.5*  HGB 18.3* 16.2 14.8  HCT 57.7* 47.4 44.7   PLT 249 164 141*  MCV 93 89.1 89.9  MCH 29.4 30.5 29.8  MCHC 31.8* 34.3 33.1  RDW 14.0 13.6 13.7  LYMPHSABS 1.4  --   --   MONOABS 1.3*  --   --   EOSABS 0.0  --   --   BASOSABS 0.1  --   --     Chemistries   Recent Labs Lab 01/03/15 1005 01/03/15 1926 01/03/15 2354 01/04/15 0404  NA 134* 143 146* 145  K 4.8 3.3*  3.3* 3.9 3.9  CL 93* 111 113* 114*  CO2 20* 24 26 24   GLUCOSE 1019* 314* 174* 281*  BUN 46* 35* 34* 32*  CREATININE 1.85* 1.10 1.09 0.95  CALCIUM 9.4 8.7* 8.5* 8.2*  AST  --   --   --  17  ALT  --   --   --  25  ALKPHOS  --   --   --  77  BILITOT  --   --   --  0.7   ------------------------------------------------------------------------------------------------------------------ estimated creatinine clearance is 105.9 mL/min (by C-G formula based on Cr of 0.95). ------------------------------------------------------------------------------------------------------------------ No results for input(s): HGBA1C in the last 72 hours. ------------------------------------------------------------------------------------------------------------------  Recent Labs  01/04/15 0406  CHOL 178  HDL 25*  LDLCALC 77  TRIG [id number]*  CHOLHDL 7.1   ------------------------------------------------------------------------------------------------------------------ No results for input(s): TSH, T4TOTAL, T3FREE, THYROIDAB in the last 72 hours.  Invalid input(s): FREET3 ------------------------------------------------------------------------------------------------------------------ No results for input(s): VITAMINB12, FOLATE, FERRITIN, TIBC, IRON, RETICCTPCT  in the last 72 hours.  Coagulation profile No results for input(s): INR, PROTIME in the last 168 hours.  No results for input(s): DDIMER in the last 72 hours.  Cardiac Enzymes No results for input(s): CKMB, TROPONINI, MYOGLOBIN in the last 168 hours.  Invalid input(s):  CK ------------------------------------------------------------------------------------------------------------------ Invalid input(s): POCBNP       Assessment & Plan   Active Problems:    1. Diabetic ketoacidosis - resolved now and BS stable   New onset diabetes- levemir and premeal insulin  Close monitoring of bg, needs out pt f/u va in 1wk   2. Acute renal failure - likely due to dehydration and hyperglycemia.  - improved and resolved as BS have corrected.   3. Tachycardia - likely due to dehydration, DKA.  - resolved now as DKA has resolved.   4. Leukocytosis - likely stress mediated from DKA - no evidence of infection. WBC count trending donw.  - cont. Empiric Ceftriaxone and will d/c in a.m. If WBC count stable.   5. DM Neuropathy - cont. Cont. Neurontin.   6. Htn -bp lower this am, hold metoprolol   7. GERD - cont. Protonix.       Medications  Scheduled Meds: . aspirin EC  81 mg Oral Daily  . cefTRIAXone (ROCEPHIN)  IV  1 g Intravenous Q24H  . cholecalciferol  1,000 Units Oral Daily  . gabapentin  900 mg Oral QHS  . heparin subcutaneous  5,000 Units Subcutaneous 3 times per day  . insulin aspart  0-24 Units Subcutaneous 6 times per day  . insulin aspart  10 Units Subcutaneous TID WC  . magic mouthwash  10 mL Oral TID  . pantoprazole  40 mg Oral BID  . sodium chloride  3 mL Intravenous Q6H  . vitamin B-12  1,000 mcg Oral Daily   Continuous Infusions: . sodium chloride 150 mL/hr at 01/05/15 0753   PRN Meds:.acetaminophen, sodium chloride   DVT Prophylaxis  Heparin SQ   Code Status: Full Code  Family Communication: Discussed plan with patient.   Disposition Plan:  D/C home today  Time Spent in minutes   30   Auburn Bilberry M.D on 01/05/2015 at 8:03 AM  [facility] Physicians Office  [phone]

## 2016-05-29 ENCOUNTER — Emergency Department
Admission: EM | Admit: 2016-05-29 | Discharge: 2016-05-29 | Disposition: A | Discharge status: Home / Self Care | Payer: Medicare Other | Attending: Student | Admitting: Student

## 2016-05-29 ENCOUNTER — Emergency Department: Payer: Medicare Other

## 2016-05-29 ENCOUNTER — Encounter: Payer: Self-pay | Admitting: Emergency Medicine

## 2016-05-29 DIAGNOSIS — Z87891 Personal history of nicotine dependence: Secondary | ICD-10-CM | POA: Insufficient documentation

## 2016-05-29 DIAGNOSIS — R0602 Shortness of breath: Secondary | ICD-10-CM

## 2016-05-29 DIAGNOSIS — J189 Pneumonia, unspecified organism: Secondary | ICD-10-CM

## 2016-05-29 DIAGNOSIS — R51 Headache: Secondary | ICD-10-CM | POA: Insufficient documentation

## 2016-05-29 DIAGNOSIS — Z79899 Other long term (current) drug therapy: Secondary | ICD-10-CM | POA: Insufficient documentation

## 2016-05-29 DIAGNOSIS — I10 Essential (primary) hypertension: Secondary | ICD-10-CM | POA: Diagnosis not present

## 2016-05-29 DIAGNOSIS — Z794 Long term (current) use of insulin: Secondary | ICD-10-CM | POA: Diagnosis not present

## 2016-05-29 DIAGNOSIS — R079 Chest pain, unspecified: Secondary | ICD-10-CM

## 2016-05-29 DIAGNOSIS — Z7982 Long term (current) use of aspirin: Secondary | ICD-10-CM | POA: Insufficient documentation

## 2016-05-29 DIAGNOSIS — E119 Type 2 diabetes mellitus without complications: Secondary | ICD-10-CM | POA: Diagnosis not present

## 2016-05-29 HISTORY — DX: Type 2 diabetes mellitus without complications: E11.9

## 2016-05-29 LAB — COMPREHENSIVE METABOLIC PANEL
ALBUMIN: 4.2 g/dL (ref 3.5–5.0)
ALT: 20 U/L (ref 17–63)
AST: 24 U/L (ref 15–41)
Alkaline Phosphatase: 81 U/L (ref 38–126)
Anion gap: 6 (ref 5–15)
BUN: 16 mg/dL (ref 6–20)
CHLORIDE: 107 mmol/L (ref 101–111)
CO2: 26 mmol/L (ref 22–32)
Calcium: 8.9 mg/dL (ref 8.9–10.3)
Creatinine, Ser: 0.99 mg/dL (ref 0.61–1.24)
GFR calc Af Amer: >60 mL/min (ref >60)
GFR calc non Af Amer: >60 mL/min (ref >60)
GLUCOSE: 145 mg/dL — AB (ref 65–99)
POTASSIUM: 4 mmol/L (ref 3.5–5.1)
Sodium: 139 mmol/L (ref 135–145)
Total Bilirubin: 1 mg/dL (ref 0.3–1.2)
Total Protein: 7.4 g/dL (ref 6.5–8.1)

## 2016-05-29 LAB — CBC WITH DIFFERENTIAL/PLATELET
BASOS ABS: 0.1 10*3/uL (ref 0–0.1)
Basophils Relative: 1 %
EOS PCT: 1 %
Eosinophils Absolute: 0.1 10*3/uL (ref 0–0.7)
HCT: 44.5 % (ref 40.0–52.0)
Hemoglobin: 15.4 g/dL (ref 13.0–18.0)
Lymphocytes Relative: 12 %
Lymphs Abs: 1.6 10*3/uL (ref 1.0–3.6)
MCH: 30.2 pg (ref 26.0–34.0)
MCHC: 34.7 g/dL (ref 32.0–36.0)
MCV: 87 fL (ref 80.0–100.0)
MONO ABS: 0.6 10*3/uL (ref 0.2–1.0)
Monocytes Relative: 4 %
Neutro Abs: 11.4 10*3/uL — ABNORMAL HIGH (ref 1.4–6.5)
Neutrophils Relative %: 82 %
PLATELETS: 191 10*3/uL (ref 150–440)
RBC: 5.12 MIL/uL (ref 4.40–5.90)
RDW: 12.9 % (ref 11.5–14.5)
WBC: 13.8 10*3/uL — ABNORMAL HIGH (ref 3.8–10.6)

## 2016-05-29 LAB — TROPONIN I
Troponin I: <0.03 ng/mL (ref <0.03)
Troponin I: <0.03 ng/mL (ref <0.03)

## 2016-05-29 MED ORDER — SODIUM CHLORIDE 0.9 % IV BOLUS (SEPSIS)
500.0000 mL | Freq: Once | INTRAVENOUS | Status: AC
  Administered 2016-05-29: 500 mL via INTRAVENOUS

## 2016-05-29 MED ORDER — ALBUTEROL SULFATE HFA 108 (90 BASE) MCG/ACT IN AERS: 2.0000 | INHALATION_SPRAY | Freq: Four times a day (QID) | RESPIRATORY_TRACT | 0 refills | Status: AC | PRN

## 2016-05-29 MED ORDER — IPRATROPIUM-ALBUTEROL 0.5-2.5 (3) MG/3ML IN SOLN
3.0000 mL | Freq: Once | RESPIRATORY_TRACT | Status: AC
  Administered 2016-05-29: 3 mL via RESPIRATORY_TRACT
  Filled 2016-05-29: qty 3

## 2016-05-29 MED ORDER — AZITHROMYCIN 250 MG PO TABS: 250.0000 mg | ORAL_TABLET | Freq: Every day | ORAL | 0 refills | Status: AC

## 2016-05-29 MED ORDER — AZITHROMYCIN 500 MG PO TABS
500.0000 mg | ORAL_TABLET | Freq: Once | ORAL | Status: AC
  Administered 2016-05-29: 500 mg via ORAL
  Filled 2016-05-29: qty 1

## 2016-05-29 MED ORDER — IBUPROFEN 600 MG PO TABS
600.0000 mg | ORAL_TABLET | Freq: Once | ORAL | Status: AC
  Administered 2016-05-29: 600 mg via ORAL
  Filled 2016-05-29: qty 1

## 2016-05-29 MED ORDER — METHYLPREDNISOLONE SODIUM SUCC 125 MG IJ SOLR
125.0000 mg | Freq: Once | INTRAMUSCULAR | Status: AC
  Administered 2016-05-29: 125 mg via INTRAVENOUS
  Filled 2016-05-29: qty 2

## 2016-05-29 MED ORDER — IOPAMIDOL (ISOVUE-370) INJECTION 76%
75.0000 mL | Freq: Once | INTRAVENOUS | Status: AC | PRN
  Administered 2016-05-29: 75 mL via INTRAVENOUS

## 2016-05-29 NOTE — ED Triage Notes (Signed)
Pt arrived to ED with c/o of chest pain that occurred early this am upon waking. Pt also states he has had a headache that started yesterday and that he just doesn't "feel right".

## 2016-05-29 NOTE — ED Provider Notes (Signed)
[facility] Emergency Department Provider Note   ____________________________________________   First MD Initiated Contact with Patient 05/29/16 0840     (approximate)  I have reviewed the triage vital signs and the nursing notes.   HISTORY  Chief Complaint Chest Pain    HPI Steven Gallegos is a 70 y.o. male with history of spinal stenosis, hypertension, diabetes with previous presentation for DKA, COPD who presents for evaluation of left sided chest pain and shortness of breath today, gradual onset, constant, worse with exertion, currently mild to moderate. Patient reports that the pain in the left chest wall, from sleep at approximately 4:30 AM, it waxes and wanes. It is not pleuritic. Pain does not radiate to the back or down towards the feet. No vomiting, diarrhea, fevers or chills. Wife reported that he did feel "hot to the touch this morning". He had a mild frontal headache this morning however reports that the headache in the front of his head is resolved and now he's having mild pain in the occiput. He did use his inhaler this morning and that seemed to help somewhat with his shortness of breath. He has also had pain between his shoulder blades but is not sure whether not his chest pain is related to that back pain.   Past Medical History:  Diagnosis Date  . Diabetes mellitus without complication (HCC)   . Hypertension   . Osteoarthritis   . Peripheral neuropathy (HCC)   . Spinal stenosis     Patient Active Problem List   Diagnosis Date Noted  . Diabetic ketoacidosis (HCC) 01/04/2015  . Acute renal failure (HCC) 01/04/2015  . Tachycardia 01/03/2015    Past Surgical History:  Procedure Laterality Date  . BACK SURGERY      Prior to Admission medications   Medication Sig Start Date End Date Taking? Authorizing Provider  aspirin EC 81 MG EC tablet Take 1 tablet (81 mg total) by mouth daily. 01/05/15  Yes Auburn BilberryShreyang Patel, MD  cyanocobalamin  500 MCG tablet Take 1,000 mcg by mouth daily.   Yes Historical Provider, MD  DiphenhydrAMINE HCl (ZZZQUIL) 50 MG/30ML LIQD Take 1 Dose by mouth at bedtime.   Yes Historical Provider, MD  Ergocalciferol (VITAMIN D2) 2000 UNITS TABS Take 1 tablet by mouth daily.   Yes Historical Provider, MD  gabapentin (NEURONTIN) 300 MG capsule Take 900 mg by mouth at bedtime.   Yes Historical Provider, MD  insulin aspart protamine- aspart (NOVOLOG MIX 70/30) (70-30) 100 UNIT/ML injection Inject 45-60 Units into the skin 2 (two) times daily with a meal. 60 units Am, 45 units PM   Yes Historical Provider, MD  Multiple Vitamins-Minerals (ONE-A-DAY MENS HEALTH FORMULA PO) Take 1 tablet by mouth daily.   Yes Historical Provider, MD  omeprazole (PRILOSEC) 20 MG capsule Take 20 mg by mouth daily.   Yes Historical Provider, MD    Allergies Flexeril [cyclobenzaprine]  History reviewed. No pertinent family history.  Social History Social History  Substance Use Topics  . Smoking status: Former Games developermoker  . Smokeless tobacco: Never Used  . Alcohol use No    Review of Systems Constitutional: No fever/chills Eyes: No visual changes. ENT: No sore throat. Cardiovascular: +chest pain. Respiratory: + shortness of breath. Gastrointestinal: No abdominal pain.  No nausea, no vomiting.  No diarrhea.  No constipation. Genitourinary: Negative for dysuria. Musculoskeletal: Negative for back pain. Skin: Negative for rash. Neurological: Positive for headache, no focal weakness or numbness.  10-point ROS otherwise negative.  ____________________________________________   PHYSICAL EXAM:    Vitals:   05/29/16 1100 05/29/16 1130 05/29/16 1200 05/29/16 1230  BP: 129/69 (!) 128/55 122/70 120/87  Pulse: 78 (!) 51 78 79  Resp: 19 (!) 21 (!) 22 20  Temp:      TempSrc:      SpO2: 94% 92% 94% 99%  Weight:      Height:          Constitutional: Alert and oriented. Nontoxic- appearing and in no acute distress. Eyes:  Conjunctivae are normal. PERRL. EOMI. Head: Atraumatic. Nose: No congestion/rhinnorhea. Mouth/Throat: Mucous membranes are moist.  Oropharynx non-erythematous. Neck: No stridor.  Supple without meningismus. Cardiovascular: Normal rate, regular rhythm. Grossly normal heart sounds.  Good peripheral circulation. Respiratory: Mild tachypnea, diffuse expiratory wheeze, good air movement. Gastrointestinal: Soft and nontender. No distention.  No CVA tenderness. Genitourinary: Deferred Musculoskeletal: No lower extremity tenderness nor edema.  No joint effusions. Neurologic:  Normal speech and language. No gross focal neurologic deficits are appreciated. No gait instability. Skin:  Skin is warm, dry and intact. No rash noted. Psychiatric: Mood and affect are normal. Speech and behavior are normal.  ____________________________________________   LABS (all labs ordered are listed, but only abnormal results are displayed)  Labs Reviewed  CBC WITH DIFFERENTIAL/PLATELET - Abnormal; Notable for the following:       Result Value   WBC 13.8 (*)    Neutro Abs 11.4 (*)    All other components within normal limits  COMPREHENSIVE METABOLIC PANEL - Abnormal; Notable for the following:    Glucose, Bld 145 (*)    All other components within normal limits  TROPONIN I  TROPONIN I   ____________________________________________  EKG  ED ECG REPORT I, Gayla Doss, the attending physician, personally viewed and interpreted this ECG.   Date: 05/29/2016  EKG Time: 09:19  Rate: 96  Rhythm: normal sinus rhythm with prolonged PR interval.  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  ____________________________________________  RADIOLOGY  CXR IMPRESSION:  1. No acute cardiopulmonary disease.  2. Mild peribronchial thickening and hyperinflation which may relate  to chronic  bronchitis or smoking.   CTA chest IMPRESSION:  1. Negative for thoracic aortic dissection or aneurysm.  2. Pulmonary emphysema with Patchy ground-glass opacities scattered  throughout the right lung, most marked in the anterior right upper  lobe, probably infectious/inflammatory etiology.  Non-contrast chest CT at 3-6 months is recommended to confirm  appropriate resolution.  If nodules persist, subsequent management will be based upon the  most suspicious nodule(s).  This recommendation follows the consensus statement: Guidelines for  Management of Incidental Pulmonary Nodules Detected on CT Images:  From the Fleischner Society 2017; Radiology 2017; 284:228-243.      ____________________________________________   PROCEDURES  Procedure(s) performed: None  Procedures  Critical Care performed: No  ____________________________________________   INITIAL IMPRESSION / ASSESSMENT AND PLAN / ED COURSE  Pertinent labs & imaging results that were available during my care of the patient were reviewed by me and considered in my medical decision making (see chart for details).  Steven Gallegos is a 70 y.o. male with history of spinal stenosis, hypertension, diabetes with previous presentation for DKA, COPD who presents for evaluation of left sided chest pain and shortness of breath today. On exam, he is nontoxic appearing and in no acute distress. Mildly tachypneic and initial O2 saturation 91% on  room air however this is improved to 96% on room air prior to any intervention. He does have diffuse expiratory wheezing concerning for possible COPD exacerbation, will treat with duo nebs. EKG is not consistent with acute ischemia. Left bundle-branch block is previously noted on prior EKG. We'll obtain screening cardiac labs, check a chest x-ray, reassess for disposition.  ----------------------------------------- 1:33 PM on 05/29/2016 -----------------------------------------  Patient  reports he feels much better after DuoNebs, he also received Solu-Medrol with improvement of his breath sounds. 2 sets of cardiac enzymes are negative, not consistent with ACS. Chest x-ray showed findings consistent with chronic bronchitis. CTA of the chest was obtained given the patient's pain between his shoulder blades to rule out dissection, there is no aortic aneurysm or dissection however there are right-sided opacities concerning for pneumonia. I suspect community-acquired pneumonia is the most likely cause of his constellation of symptoms. Patient received azithromycin in the emergency department and will be discharged with same. He will continue to use his inhaler as needed. We discussed with this return precautions and need for close PCP follow-up and he is comfortable with the discharge plan. DC home.  Clinical Course     ____________________________________________   FINAL CLINICAL IMPRESSION(S) / ED DIAGNOSES  Final diagnoses:  Chest pain, unspecified chest pain type  SOB (shortness of breath)  Community acquired pneumonia      NEW MEDICATIONS STARTED DURING THIS VISIT:  New Prescriptions   No medications on file     Note:  This document was prepared using Dragon voice recognition software and may include unintentional dictation errors.    Gayla Doss, MD 05/29/16 [phone]

## 2017-01-19 ENCOUNTER — Encounter: Payer: Self-pay | Admitting: Medical Oncology

## 2017-01-19 ENCOUNTER — Emergency Department: Payer: Medicare Other

## 2017-01-19 ENCOUNTER — Emergency Department
Admission: EM | Admit: 2017-01-19 | Discharge: 2017-01-19 | Disposition: A | Discharge status: Home / Self Care | Payer: Medicare Other | Attending: Emergency Medicine | Admitting: Emergency Medicine

## 2017-01-19 DIAGNOSIS — E119 Type 2 diabetes mellitus without complications: Secondary | ICD-10-CM | POA: Diagnosis not present

## 2017-01-19 DIAGNOSIS — Z79899 Other long term (current) drug therapy: Secondary | ICD-10-CM | POA: Insufficient documentation

## 2017-01-19 DIAGNOSIS — R079 Chest pain, unspecified: Secondary | ICD-10-CM | POA: Diagnosis not present

## 2017-01-19 DIAGNOSIS — I10 Essential (primary) hypertension: Secondary | ICD-10-CM | POA: Insufficient documentation

## 2017-01-19 DIAGNOSIS — Z87891 Personal history of nicotine dependence: Secondary | ICD-10-CM | POA: Diagnosis not present

## 2017-01-19 DIAGNOSIS — Z7982 Long term (current) use of aspirin: Secondary | ICD-10-CM | POA: Insufficient documentation

## 2017-01-19 DIAGNOSIS — Z794 Long term (current) use of insulin: Secondary | ICD-10-CM | POA: Diagnosis not present

## 2017-01-19 LAB — BASIC METABOLIC PANEL
ANION GAP: 6 (ref 5–15)
BUN: 18 mg/dL (ref 6–20)
CALCIUM: 8.7 mg/dL — AB (ref 8.9–10.3)
CO2: 24 mmol/L (ref 22–32)
Chloride: 107 mmol/L (ref 101–111)
Creatinine, Ser: 0.89 mg/dL (ref 0.61–1.24)
GFR calc non Af Amer: >60 mL/min (ref >60)
Glucose, Bld: 188 mg/dL — ABNORMAL HIGH (ref 65–99)
Potassium: 4.1 mmol/L (ref 3.5–5.1)
Sodium: 137 mmol/L (ref 135–145)

## 2017-01-19 LAB — CBC
HCT: 44.9 % (ref 40.0–52.0)
HEMOGLOBIN: 15.1 g/dL (ref 13.0–18.0)
MCH: 28.8 pg (ref 26.0–34.0)
MCHC: 33.7 g/dL (ref 32.0–36.0)
MCV: 85.6 fL (ref 80.0–100.0)
Platelets: 170 10*3/uL (ref 150–440)
RBC: 5.25 MIL/uL (ref 4.40–5.90)
RDW: 13.6 % (ref 11.5–14.5)
WBC: 13.6 10*3/uL — ABNORMAL HIGH (ref 3.8–10.6)

## 2017-01-19 LAB — TROPONIN I

## 2017-01-19 MED ORDER — KETOROLAC TROMETHAMINE 30 MG/ML IJ SOLN
30.0000 mg | Freq: Once | INTRAMUSCULAR | Status: AC
  Administered 2017-01-19: 30 mg via INTRAVENOUS
  Filled 2017-01-19: qty 1

## 2017-01-19 NOTE — ED Triage Notes (Addendum)
Pt from home via ems with reports of left sided chest pain that began about 2 hrs pta, pt reports pain radiates into side of the neck. Pt reports sob but baseline. 324mg  of ASA given pta by ems.

## 2017-01-19 NOTE — ED Notes (Signed)
No change in patient condition. Pt family remains at bedside at this time. Explained delay to patient, pt and family state understanding.

## 2017-01-19 NOTE — ED Notes (Signed)
NAD noted at this time. Pt resting in bed with family at bedside. Will continue to monitor for further patient needs.

## 2017-01-19 NOTE — ED Provider Notes (Signed)
[facility] Emergency Department Provider Note       Time seen: ----------------------------------------- 2:45 PM on 01/19/2017 -----------------------------------------     I have reviewed the triage vital signs and the nursing notes.   HISTORY   Chief Complaint Chest Pain    HPI Steven Gallegos is a 71 y.o. male who presents to the ED for left-sided chest pain that began about 2 hours prior to arrival. He reports pain had radiated once into his neck. He has not had sweats, nausea, shortness of breath. He was given aspirin prior to arrival by EMS. He describes the pain as an ache. He had a nuclear stress test 4 months ago which was normal according to him. He has not been sick, denies any other complaints and the pain has resolved at this time.   Past Medical History:  Diagnosis Date  . Diabetes mellitus without complication (HCC)   . Hypertension   . Osteoarthritis   . Peripheral neuropathy   . Spinal stenosis     Patient Active Problem List   Diagnosis Date Noted  . Diabetic ketoacidosis (HCC) 01/04/2015  . Acute renal failure (HCC) 01/04/2015  . Tachycardia 01/03/2015    Past Surgical History:  Procedure Laterality Date  . BACK SURGERY      Allergies Flexeril [cyclobenzaprine]  Social History Social History  Substance Use Topics  . Smoking status: Former Games developer  . Smokeless tobacco: Never Used  . Alcohol use No    Review of Systems Constitutional: Negative for fever. Eyes: Negative for vision changes ENT:  Negative for congestion, sore throat Cardiovascular: Positive for chest pain Respiratory: Negative for shortness of breath. Gastrointestinal: Negative for abdominal pain, vomiting and diarrhea. Genitourinary: Negative for dysuria. Musculoskeletal: Negative for back pain. Skin: Negative for rash. Neurological: Negative for headaches, focal weakness or numbness.  All systems negative/normal/unremarkable except as  stated in the HPI  ____________________________________________   PHYSICAL EXAM:  VITAL SIGNS: ED Triage Vitals  Enc Vitals Group     BP 01/19/17 1337 (!) 158/92     Pulse Rate 01/19/17 1337 92     Resp 01/19/17 1337 18     Temp 01/19/17 1337 98.5 F (36.9 C)     Temp Source 01/19/17 1337 Oral     SpO2 01/19/17 1337 96 %     Weight 01/19/17 1334 260 lb (117.9 kg)     Height --      Head Circumference --      Peak Flow --      Pain Score 01/19/17 1334 7     Pain Loc --      Pain Edu? --      Excl. in GC? --     Constitutional: Alert and oriented. Well appearing and in no distress. Eyes: Conjunctivae are normal. PERRL. Normal extraocular movements. ENT   Head: Normocephalic and atraumatic.   Nose: No congestion/rhinnorhea.   Mouth/Throat: Mucous membranes are moist.   Neck: No stridor. Cardiovascular: Normal rate, regular rhythm. No murmurs, rubs, or gallops. Respiratory: Normal respiratory effort without tachypnea nor retractions. Breath sounds are clear and equal bilaterally. No wheezes/rales/rhonchi. Gastrointestinal: Soft and nontender. Normal bowel sounds Musculoskeletal: Nontender with normal range of motion in extremities. No lower extremity tenderness nor edema. Neurologic:  Normal speech and language. No gross focal neurologic deficits are appreciated.  Skin:  Skin is warm, dry and intact. No rash noted. Psychiatric: Mood and affect are normal. Speech and behavior are normal.  ____________________________________________  EKG: Interpreted by  me. Sinus rhythm with first-degree AV block, left anterior fascicular block, normal QT.  ____________________________________________  ED COURSE:  Pertinent labs & imaging results that were available during my care of the patient were reviewed by me and considered in my medical decision making (see chart for details). Patient presents for chest pain, we will assess with labs and imaging as indicated.    Procedures ____________________________________________   LABS (pertinent positives/negatives)  Labs Reviewed  BASIC METABOLIC PANEL - Abnormal; Notable for the following:       Result Value   Glucose, Bld 188 (*)    Calcium 8.7 (*)    All other components within normal limits  CBC - Abnormal; Notable for the following:    WBC 13.6 (*)    All other components within normal limits  TROPONIN I  TROPONIN I    RADIOLOGY Images were viewed by me  Chest x-ray reveals no acute findings  ____________________________________________  FINAL ASSESSMENT AND PLAN  Chest pain  Plan: [name] labs and imaging were dictated above. Patient had presented for chest pain of uncertain etiology with 2 negative troponins. Patient had a negative nuclear stress test 4 months ago and appears to be chest pain-free at this time. He is stable for outpatient follow-up with his cardiologist.   Emily FilbertWilliams, Yahir Tavano E, MD   Note: This note was generated in part or whole with voice recognition software. Voice recognition is usually quite accurate but there are transcription errors that can and very often do occur. I apologize for any typographical errors that were not detected and corrected.     Emily FilbertWilliams, Jodene Polyak E, MD 01/19/17 [phone]

## 2017-01-19 NOTE — ED Notes (Signed)
NAD noted at time of D/C. Pt taken to lobby via wheelchair by his son. Denies any comments/concerns at this time.

## 2020-09-10 ENCOUNTER — Emergency Department: Payer: No Typology Code available for payment source

## 2020-09-10 ENCOUNTER — Other Ambulatory Visit: Payer: Self-pay

## 2020-09-10 ENCOUNTER — Inpatient Hospital Stay
Admission: EM | Admit: 2020-09-10 | Discharge: 2020-10-06 | DRG: 871 | Disposition: E | Payer: No Typology Code available for payment source | Attending: Internal Medicine | Admitting: Internal Medicine

## 2020-09-10 DIAGNOSIS — I10 Essential (primary) hypertension: Secondary | ICD-10-CM | POA: Diagnosis present

## 2020-09-10 DIAGNOSIS — Z7984 Long term (current) use of oral hypoglycemic drugs: Secondary | ICD-10-CM

## 2020-09-10 DIAGNOSIS — Z452 Encounter for adjustment and management of vascular access device: Secondary | ICD-10-CM

## 2020-09-10 DIAGNOSIS — U071 COVID-19: Secondary | ICD-10-CM | POA: Diagnosis present

## 2020-09-10 DIAGNOSIS — Z794 Long term (current) use of insulin: Secondary | ICD-10-CM

## 2020-09-10 DIAGNOSIS — R6521 Severe sepsis with septic shock: Secondary | ICD-10-CM | POA: Diagnosis not present

## 2020-09-10 DIAGNOSIS — E66811 Obesity, class 1: Secondary | ICD-10-CM | POA: Diagnosis present

## 2020-09-10 DIAGNOSIS — I4891 Unspecified atrial fibrillation: Secondary | ICD-10-CM | POA: Diagnosis present

## 2020-09-10 DIAGNOSIS — J9601 Acute respiratory failure with hypoxia: Secondary | ICD-10-CM

## 2020-09-10 DIAGNOSIS — N170 Acute kidney failure with tubular necrosis: Secondary | ICD-10-CM | POA: Diagnosis present

## 2020-09-10 DIAGNOSIS — Z9114 Patient's other noncompliance with medication regimen: Secondary | ICD-10-CM

## 2020-09-10 DIAGNOSIS — E875 Hyperkalemia: Secondary | ICD-10-CM | POA: Diagnosis not present

## 2020-09-10 DIAGNOSIS — E1142 Type 2 diabetes mellitus with diabetic polyneuropathy: Secondary | ICD-10-CM | POA: Diagnosis present

## 2020-09-10 DIAGNOSIS — Z6832 Body mass index (BMI) 32.0-32.9, adult: Secondary | ICD-10-CM

## 2020-09-10 DIAGNOSIS — R0602 Shortness of breath: Secondary | ICD-10-CM

## 2020-09-10 DIAGNOSIS — Z79899 Other long term (current) drug therapy: Secondary | ICD-10-CM

## 2020-09-10 DIAGNOSIS — J1282 Pneumonia due to coronavirus disease 2019: Secondary | ICD-10-CM | POA: Diagnosis not present

## 2020-09-10 DIAGNOSIS — Z7982 Long term (current) use of aspirin: Secondary | ICD-10-CM

## 2020-09-10 DIAGNOSIS — Z515 Encounter for palliative care: Secondary | ICD-10-CM

## 2020-09-10 DIAGNOSIS — A4189 Other specified sepsis: Secondary | ICD-10-CM | POA: Diagnosis not present

## 2020-09-10 DIAGNOSIS — G629 Polyneuropathy, unspecified: Secondary | ICD-10-CM

## 2020-09-10 DIAGNOSIS — G928 Other toxic encephalopathy: Secondary | ICD-10-CM | POA: Diagnosis not present

## 2020-09-10 DIAGNOSIS — E43 Unspecified severe protein-calorie malnutrition: Secondary | ICD-10-CM | POA: Diagnosis present

## 2020-09-10 DIAGNOSIS — Z66 Do not resuscitate: Secondary | ICD-10-CM | POA: Diagnosis not present

## 2020-09-10 DIAGNOSIS — E669 Obesity, unspecified: Secondary | ICD-10-CM | POA: Diagnosis present

## 2020-09-10 DIAGNOSIS — E1165 Type 2 diabetes mellitus with hyperglycemia: Secondary | ICD-10-CM | POA: Diagnosis present

## 2020-09-10 DIAGNOSIS — J80 Acute respiratory distress syndrome: Secondary | ICD-10-CM | POA: Diagnosis present

## 2020-09-10 DIAGNOSIS — F419 Anxiety disorder, unspecified: Secondary | ICD-10-CM | POA: Diagnosis present

## 2020-09-10 DIAGNOSIS — I255 Ischemic cardiomyopathy: Secondary | ICD-10-CM | POA: Diagnosis present

## 2020-09-10 DIAGNOSIS — E119 Type 2 diabetes mellitus without complications: Secondary | ICD-10-CM

## 2020-09-10 DIAGNOSIS — Z87891 Personal history of nicotine dependence: Secondary | ICD-10-CM

## 2020-09-10 HISTORY — DX: Type 2 diabetes mellitus without complications: E11.9

## 2020-09-10 HISTORY — DX: Obesity, unspecified: E66.9

## 2020-09-10 HISTORY — DX: Unspecified atrial fibrillation: I48.91

## 2020-09-10 LAB — CBG MONITORING, ED
Glucose-Capillary: 412 mg/dL — ABNORMAL HIGH (ref 70–99)
Glucose-Capillary: 335 mg/dL — ABNORMAL HIGH (ref 70–99)
Glucose-Capillary: 304 mg/dL — ABNORMAL HIGH (ref 70–99)

## 2020-09-10 LAB — LACTIC ACID, PLASMA
Lactic Acid, Venous: 2.9 mmol/L (ref 0.5–1.9)
Lactic Acid, Venous: 6.4 mmol/L (ref 0.5–1.9)
Lactic Acid, Venous: 4.2 mmol/L (ref 0.5–1.9)

## 2020-09-10 LAB — COMPREHENSIVE METABOLIC PANEL
ALT: 31 U/L (ref 0–44)
AST: 46 U/L — ABNORMAL HIGH (ref 15–41)
Albumin: 2.3 g/dL — ABNORMAL LOW (ref 3.5–5.0)
Alkaline Phosphatase: 82 U/L (ref 38–126)
Anion gap: 16 — ABNORMAL HIGH (ref 5–15)
BUN: 29 mg/dL — ABNORMAL HIGH (ref 8–23)
CO2: 20 mmol/L — ABNORMAL LOW (ref 22–32)
Calcium: 8.1 mg/dL — ABNORMAL LOW (ref 8.9–10.3)
Chloride: 108 mmol/L (ref 98–111)
Creatinine, Ser: 0.88 mg/dL (ref 0.61–1.24)
GFR, Estimated: >60 mL/min (ref >60)
Glucose, Bld: 330 mg/dL — ABNORMAL HIGH (ref 70–99)
Potassium: 3.3 mmol/L — ABNORMAL LOW (ref 3.5–5.1)
Sodium: 144 mmol/L (ref 135–145)
Total Bilirubin: 1.1 mg/dL (ref 0.3–1.2)
Total Protein: 6 g/dL — ABNORMAL LOW (ref 6.5–8.1)

## 2020-09-10 LAB — CBC WITH DIFFERENTIAL/PLATELET
Abs Immature Granulocytes: 0 10*3/uL (ref 0.00–0.07)
Basophils Absolute: 0 10*3/uL (ref 0.0–0.1)
Basophils Relative: 0 %
Eosinophils Absolute: 0 10*3/uL (ref 0.0–0.5)
Eosinophils Relative: 0 %
HCT: 49.6 % (ref 39.0–52.0)
Hemoglobin: 17 g/dL (ref 13.0–17.0)
Lymphocytes Relative: 9 %
Lymphs Abs: 0.8 10*3/uL (ref 0.7–4.0)
MCH: 29.5 pg (ref 26.0–34.0)
MCHC: 34.3 g/dL (ref 30.0–36.0)
MCV: 86.1 fL (ref 80.0–100.0)
Monocytes Absolute: 0.3 10*3/uL (ref 0.1–1.0)
Monocytes Relative: 3 %
Neutro Abs: 7.5 10*3/uL (ref 1.7–7.7)
Neutrophils Relative %: 88 %
Platelets: 259 10*3/uL (ref 150–400)
RBC: 5.76 MIL/uL (ref 4.22–5.81)
RDW: 12.7 % (ref 11.5–15.5)
WBC: 8.5 10*3/uL (ref 4.0–10.5)
nRBC: 0 % (ref 0.0–0.2)

## 2020-09-10 LAB — URINALYSIS, COMPLETE (UACMP) WITH MICROSCOPIC
Bacteria, UA: NONE SEEN
Bilirubin Urine: NEGATIVE
Glucose, UA: >=500 mg/dL — AB
Ketones, ur: 20 mg/dL — AB
Leukocytes,Ua: NEGATIVE
Nitrite: NEGATIVE
Protein, ur: 100 mg/dL — AB
Specific Gravity, Urine: 1.036 — ABNORMAL HIGH (ref 1.005–1.030)
pH: 5 (ref 5.0–8.0)

## 2020-09-10 LAB — TROPONIN I (HIGH SENSITIVITY)
Troponin I (High Sensitivity): 21 ng/L — ABNORMAL HIGH (ref <18)
Troponin I (High Sensitivity): 21 ng/L — ABNORMAL HIGH (ref <18)

## 2020-09-10 LAB — FERRITIN: Ferritin: 845 ng/mL — ABNORMAL HIGH (ref 24–336)

## 2020-09-10 LAB — PROTIME-INR
INR: 1.2 (ref 0.8–1.2)
Prothrombin Time: 14.6 seconds (ref 11.4–15.2)

## 2020-09-10 LAB — PHOSPHORUS: Phosphorus: 2.7 mg/dL (ref 2.5–4.6)

## 2020-09-10 LAB — POC SARS CORONAVIRUS 2 AG -  ED: SARS Coronavirus 2 Ag: POSITIVE — AB

## 2020-09-10 LAB — APTT: aPTT: 30 seconds (ref 24–36)

## 2020-09-10 LAB — BRAIN NATRIURETIC PEPTIDE: B Natriuretic Peptide: 43 pg/mL (ref 0.0–100.0)

## 2020-09-10 LAB — MAGNESIUM: Magnesium: 12.5 mg/dL (ref 1.7–2.4)

## 2020-09-10 LAB — LACTATE DEHYDROGENASE: LDH: 455 U/L — ABNORMAL HIGH (ref 98–192)

## 2020-09-10 MED ORDER — SODIUM CHLORIDE 0.9 % IV SOLN
500.0000 mg | INTRAVENOUS | Status: DC
  Administered 2020-09-10: 500 mg via INTRAVENOUS
  Filled 2020-09-10: qty 500

## 2020-09-10 MED ORDER — ACETAMINOPHEN 650 MG RE SUPP
650.0000 mg | Freq: Four times a day (QID) | RECTAL | Status: DC | PRN
  Administered 2020-09-13: 650 mg via RECTAL
  Filled 2020-09-10: qty 1

## 2020-09-10 MED ORDER — POTASSIUM CHLORIDE CRYS ER 20 MEQ PO TBCR
40.0000 meq | EXTENDED_RELEASE_TABLET | Freq: Two times a day (BID) | ORAL | Status: DC
  Administered 2020-09-10: 40 meq via ORAL
  Filled 2020-09-10: qty 2

## 2020-09-10 MED ORDER — LACTATED RINGERS IV BOLUS
1000.0000 mL | Freq: Once | INTRAVENOUS | Status: AC
  Administered 2020-09-10: 1000 mL via INTRAVENOUS

## 2020-09-10 MED ORDER — ZINC SULFATE 220 (50 ZN) MG PO CAPS
220.0000 mg | ORAL_CAPSULE | Freq: Every day | ORAL | Status: DC
  Administered 2020-09-10 – 2020-09-15 (×6): 220 mg via ORAL
  Filled 2020-09-10 (×5): qty 1

## 2020-09-10 MED ORDER — DEXAMETHASONE SODIUM PHOSPHATE 10 MG/ML IJ SOLN
6.0000 mg | INTRAMUSCULAR | Status: DC
  Administered 2020-09-10 – 2020-09-14 (×5): 6 mg via INTRAVENOUS
  Filled 2020-09-10: qty 1
  Filled 2020-09-10 (×3): qty 0.6
  Filled 2020-09-10 (×2): qty 1

## 2020-09-10 MED ORDER — ALBUTEROL SULFATE HFA 108 (90 BASE) MCG/ACT IN AERS
2.0000 | INHALATION_SPRAY | Freq: Four times a day (QID) | RESPIRATORY_TRACT | Status: DC
  Administered 2020-09-10 – 2020-09-13 (×9): 2 via RESPIRATORY_TRACT
  Filled 2020-09-10 (×2): qty 6.7

## 2020-09-10 MED ORDER — INSULIN ASPART 100 UNIT/ML ~~LOC~~ SOLN
20.0000 [IU] | Freq: Once | SUBCUTANEOUS | Status: AC
  Administered 2020-09-10: 20 [IU] via SUBCUTANEOUS
  Filled 2020-09-10: qty 1

## 2020-09-10 MED ORDER — MAGNESIUM SULFATE 2 GM/50ML IV SOLN
2.0000 g | Freq: Once | INTRAVENOUS | Status: AC
  Administered 2020-09-10: 2 g via INTRAVENOUS
  Filled 2020-09-10: qty 50

## 2020-09-10 MED ORDER — LINAGLIPTIN 5 MG PO TABS
5.0000 mg | ORAL_TABLET | Freq: Every day | ORAL | Status: DC
  Administered 2020-09-10 – 2020-09-15 (×6): 5 mg via ORAL
  Filled 2020-09-10 (×6): qty 1

## 2020-09-10 MED ORDER — ENOXAPARIN SODIUM 60 MG/0.6ML ~~LOC~~ SOLN
0.5000 mg/kg | SUBCUTANEOUS | Status: DC
  Administered 2020-09-10 – 2020-09-12 (×3): 60 mg via SUBCUTANEOUS
  Filled 2020-09-10 (×3): qty 0.6

## 2020-09-10 MED ORDER — ASCORBIC ACID 500 MG PO TABS
500.0000 mg | ORAL_TABLET | Freq: Every day | ORAL | Status: DC
  Administered 2020-09-10 – 2020-09-15 (×6): 500 mg via ORAL
  Filled 2020-09-10 (×7): qty 1

## 2020-09-10 MED ORDER — ONDANSETRON HCL 4 MG PO TABS: 4.0000 mg | ORAL_TABLET | Freq: Four times a day (QID) | ORAL | Status: DC | PRN

## 2020-09-10 MED ORDER — INSULIN ASPART 100 UNIT/ML ~~LOC~~ SOLN
0.0000 [IU] | Freq: Three times a day (TID) | SUBCUTANEOUS | Status: DC
  Administered 2020-09-11: 7 [IU] via SUBCUTANEOUS
  Administered 2020-09-11 – 2020-09-12 (×3): 11 [IU] via SUBCUTANEOUS
  Administered 2020-09-12: 15 [IU] via SUBCUTANEOUS
  Filled 2020-09-10 (×5): qty 1

## 2020-09-10 MED ORDER — SODIUM CHLORIDE 0.9 % IV SOLN
100.0000 mg | Freq: Every day | INTRAVENOUS | Status: AC
  Administered 2020-09-11 – 2020-09-14 (×4): 100 mg via INTRAVENOUS
  Filled 2020-09-10 (×3): qty 20
  Filled 2020-09-10: qty 100

## 2020-09-10 MED ORDER — SODIUM CHLORIDE 0.9 % IV SOLN
200.0000 mg | Freq: Once | INTRAVENOUS | Status: AC
  Administered 2020-09-10: 200 mg via INTRAVENOUS
  Filled 2020-09-10: qty 200

## 2020-09-10 MED ORDER — ACETAMINOPHEN 325 MG PO TABS
650.0000 mg | ORAL_TABLET | Freq: Four times a day (QID) | ORAL | Status: DC | PRN
Start: 2020-09-10 — End: 2020-09-15
  Administered 2020-09-12: 650 mg via ORAL
  Filled 2020-09-10: qty 2

## 2020-09-10 MED ORDER — HYDROCOD POLST-CPM POLST ER 10-8 MG/5ML PO SUER
5.0000 mL | Freq: Two times a day (BID) | ORAL | Status: DC | PRN
  Administered 2020-09-12 – 2020-09-14 (×2): 5 mL via ORAL
  Filled 2020-09-10 (×2): qty 5

## 2020-09-10 MED ORDER — LACTATED RINGERS IV SOLN: INTRAVENOUS | Status: AC

## 2020-09-10 MED ORDER — SODIUM CHLORIDE 0.9 % IV SOLN
2.0000 g | INTRAVENOUS | Status: DC
  Administered 2020-09-10: 2 g via INTRAVENOUS
  Filled 2020-09-10: qty 20

## 2020-09-10 MED ORDER — GUAIFENESIN-DM 100-10 MG/5ML PO SYRP
10.0000 mL | ORAL_SOLUTION | ORAL | Status: DC | PRN
  Filled 2020-09-10: qty 10

## 2020-09-10 MED ORDER — ONDANSETRON HCL 4 MG/2ML IJ SOLN: 4.0000 mg | Freq: Four times a day (QID) | INTRAMUSCULAR | Status: DC | PRN

## 2020-09-10 MED ORDER — INSULIN DETEMIR 100 UNIT/ML ~~LOC~~ SOLN
0.1500 [IU]/kg | Freq: Two times a day (BID) | SUBCUTANEOUS | Status: DC
  Administered 2020-09-10 – 2020-09-11 (×3): 18 [IU] via SUBCUTANEOUS
  Filled 2020-09-10 (×6): qty 0.18

## 2020-09-10 NOTE — Progress Notes (Signed)
Following for code sepsis 

## 2020-09-10 NOTE — Consult Note (Signed)
CODE SEPSIS - PHARMACY COMMUNICATION  **Broad Spectrum Antibiotics should be administered within 1 hour of Sepsis diagnosis**  Time Code Sepsis Called/Page Received: 1752  Antibiotics Ordered: Ceftriaxone, Azithromycin  Time of 1st antibiotic administration: 1810  Additional action taken by pharmacy: n/a  If necessary, Name of Provider/Nurse Contacted: n/a    Sharen Hones ,PharmD, BCPS Clinical Pharmacist  09/14/2020  7:15 PM

## 2020-09-10 NOTE — ED Triage Notes (Signed)
Patient reports feeling bad for 1 week. Patient reports he has been laying in bed and not taking any medications. Hx of diabetes. Per EMS patient oxygen 72% on RA. Patient placed on NRB 15 in route to ER. Patient A&OX3 on arrival to ER. BGL 412.

## 2020-09-10 NOTE — ED Notes (Signed)
Dr Ortiz at bedside 

## 2020-09-10 NOTE — ED Notes (Signed)
XR at bedside, RT at  Bedside to place HFNC

## 2020-09-10 NOTE — Consult Note (Signed)
Remdesivir - Pharmacy Brief Note   O:  ALT: 20 CXR: Moderate severity bilateral multifocal infiltrates, most prominent within the bilateral lung bases. SpO2: 97% on 50 L HFNC   A/P:  Remdesivir 200 mg IVPB once followed by 100 mg IVPB daily x 4 days.   Reatha Armour, PharmD Pharmacy Resident  Sep 13, 2020 7:59 PM

## 2020-09-10 NOTE — Progress Notes (Signed)
Notified provider of need to order repeat lactic acid. LA ordered and drawn

## 2020-09-10 NOTE — Progress Notes (Signed)
PHARMACIST - PHYSICIAN COMMUNICATION  CONCERNING:  Enoxaparin (Lovenox) for DVT Prophylaxis    RECOMMENDATION: Patient was prescribed enoxaprin 40mg  q24 hours for VTE prophylaxis.   Filed Weights   September 22, 2020 1751  Weight: 119.7 kg (264 lb)    Body mass index is 32.14 kg/m.  Estimated Creatinine Clearance: 105.7 mL/min (by C-G formula based on SCr of 0.88 mg/dL).   Based on [facility] policy patient is candidate for enoxaparin 0.5mg /kg TBW SQ every 24 hours based on BMI being >30.  DESCRIPTION: Pharmacy has adjusted enoxaparin dose per [facility] policy.  Patient is now receiving enoxaparin 60 mg every 24 hours   [facility], PharmD Pharmacy Resident  2020/09/22 8:02 PM

## 2020-09-10 NOTE — H&P (Signed)
History and Physical    Steven Gallegos TMH:[id number] DOB: 09/08/1945 DOA: 2020-09-12  PCP: Cleta Alberts, MD  Patient coming from: Home.  I have personally briefly reviewed patient's old medical records in [facility]  Chief Complaint: Shortness of breath.  HPI: Steven Gallegos is a 75 y.o. male with medical history significant of class I obesity, hypertension, osteoarthritis, diabetic peripheral neuropathy, type 2 diabetes, spinal stenosis who is coming to the emergency department with complaints of progressively worse dyspnea, fever, chills, night sweats, fatigue, malaise, myalgias/arthralgias, decreased appetite, sore throat, nonproductive cough for about a week. He has no known exposures to COVID-19 and had the [employer] vaccine back in September. Denies chest pain, palpitations, dizziness, diaphoresis, PND orthopnea. No abdominal pain, nausea, vomiting, diarrhea, constipation, melena or hematochezia. Denies dysuria, frequency or materia. Denies polyuria, polydipsia, polyphagia or blurred vision.  ED Course: Initial vital signs were temperature 98.1 F, pulse 99, respirations 30, BP 132/84 mmHg O2 sat 92% and normal high flow nasal cannula. The patient received a 1000 mL LR bolus in the ED. I added another 1000 mL of LR bolus, 20 units of insulin SQ and begun COVID-19 treatment protocol.  Labwork: Urinalysis showed glucosuria more than 500, ketonuria 20 and proteinuria 100 mg/dL. CBC was normal with a hemoglobin of 17.0 g/dL. PT/INR/PTT within expected values. Venous blood gas showed a pH of 7.35, PCO2 of 37 mmHg acid base deficit 4.6 mmol/L. Lactic acid was 2.9 then 6.4 than 4.2 mmol/L. Magnesium level was not added on and drawn while the patient was receiving the infusion and resulted in 12.5 mg/dL. Phosphorus and corrected to albumin and calcium were normal. Potassium is 3.3, and CO2 20 mmol/L. Anion gap was 16. Glucose 330, BUN 29 and creatinine 0.88 mg/dL. Total  protein 6.0, albumin 2.3 g/dL.  Imaging: Portable chest radiograph shows moderate severity bilateral multifocal infiltrates most prominent in the basis. Please see image and full regular report for further detail.  Review of Systems: As per HPI otherwise all other systems reviewed and are negative.  Past Medical History:  Diagnosis Date  . Class 1 obesity   . Hypertension   . Osteoarthritis   . Peripheral neuropathy   . Spinal stenosis   . Type 2 diabetes mellitus (HCC) 09-12-20    Past Surgical History:  Procedure Laterality Date  . BACK SURGERY      Social History  reports that he has quit smoking. He has never used smokeless tobacco. He reports that he does not drink alcohol and does not use drugs.  Allergies  Allergen Reactions  . Flexeril [Cyclobenzaprine] Other (See Comments)    Reaction:  Pt gets jittery    Family History  Problem Relation Age of Onset  . Diabetes Mellitus II Brother    Prior to Admission medications   Medication Sig Start Date End Date Taking? Authorizing Provider  albuterol (PROVENTIL HFA;VENTOLIN HFA) 108 (90 Base) MCG/ACT inhaler Inhale 2 puffs into the lungs every 6 (six) hours as needed for wheezing or shortness of breath. 05/29/16   Gayla Doss, MD  aspirin EC 81 MG EC tablet Take 1 tablet (81 mg total) by mouth daily. 01/05/15   Auburn Bilberry, MD  cyanocobalamin 500 MCG tablet Take 1,000 mcg by mouth daily.    [provider]  Ergocalciferol (VITAMIN D2) 2000 UNITS TABS Take 1 tablet by mouth daily.    [provider]  gabapentin (NEURONTIN) 300 MG capsule Take 900 mg by mouth at bedtime.  [provider]  insulin aspart protamine- aspart (NOVOLOG MIX 70/30) (70-30) 100 UNIT/ML injection Inject 45-60 Units into the skin 2 (two) times daily with a meal. 60 units Am, 45 units PM    [provider]  insulin detemir (LEVEMIR) 100 UNIT/ML injection Inject 25 Units into the skin at bedtime. 01/05/15   [provider]  Multiple Vitamins-Minerals (ONE-A-DAY MENS HEALTH FORMULA PO) Take 1 tablet by mouth daily.    [provider]  omeprazole (PRILOSEC) 20 MG capsule Take 20 mg by mouth daily.    [provider]    Physical Exam: Vitals:   September 20, 2020 2000 September 20, 2020 2030 September 20, 2020 2100 09-20-2020 2130  BP: (!) 156/87 (!) 147/91 (!) 166/88 (!) 145/78  Pulse: 97 99 99 (!) 105  Resp: (!) 32 (!) 23 20 (!) 32  Temp:      TempSrc:      SpO2: 100% 100% 100% 100%  Weight:      Height:       Constitutional: Looks acutely ill. Eyes: PERRL, lids and conjunctivae are injected. ENMT: High flow nasal cannula in place. Mucous membranes are felt dry by the patient. Neck: normal, supple, no masses, no thyromegaly Respiratory: Tachypneic in the mid 20s with bilateral scattered crackles, mild wheezing and rhonchi. No accessory muscle use.  Cardiovascular: Regular rate and rhythm, no murmurs / rubs / gallops. No extremity edema. 2+ pedal pulses. No carotid bruits.  Abdomen: Obese, nondistended. Bowel sounds positive. Soft, no tenderness, no masses palpated. No hepatosplenomegaly. Musculoskeletal: no clubbing / cyanosis.  Good ROM, no contractures. Normal muscle tone.  Skin: no major rashes, lesions, ulcers on very limited dermatological examination. Neurologic: CN 2-12 grossly intact. Sensation intact, DTR normal. Strength 5/5 in all 4.  Psychiatric: Alert and oriented x 3. Normal mood.   Labs on Admission: I have personally reviewed following labs and imaging studies  CBC: Recent Labs  Lab 2020-09-20 1752  WBC 8.5  NEUTROABS 7.5  HGB 17.0  HCT 49.6  MCV 86.1  PLT 259    Basic Metabolic Panel: Recent Labs  Lab September 20, 2020 1925  NA 144  K 3.3*  CL 108  CO2 20*  GLUCOSE 330*  BUN 29*  CREATININE 0.88  CALCIUM 8.1*    GFR: Estimated Creatinine Clearance: 105.7 mL/min (by C-G formula based on SCr of 0.88 mg/dL).  Liver Function Tests: Recent Labs  Lab 2020-09-20 1925  AST  46*  ALT 31  ALKPHOS 82  BILITOT 1.1  PROT 6.0*  ALBUMIN 2.3*    Urine analysis:    Component Value Date/Time   COLORURINE YELLOW (A) Sep 20, 2020 1925   APPEARANCEUR HAZY (A) 09-20-2020 1925        LABSPEC 1.036 (H) September 20, 2020 1925        PHURINE 5.0 2020/09/20 1925   GLUCOSEU >=500 (A) 2020/09/20 1925        HGBUR SMALL (A) 09/20/20 1925   BILIRUBINUR NEGATIVE 09-20-20 1925        KETONESUR 20 (A) 09/20/20 1925   PROTEINUR 100 (A) Sep 20, 2020 1925   NITRITE NEGATIVE 09-20-20 1925   LEUKOCYTESUR NEGATIVE 2020/09/20 1925        Radiological Exams on Admission: DG Chest Port 1 View  Result Date: September 20, 2020 CLINICAL DATA:  Feeling bad for 1 week. EXAM: PORTABLE CHEST 1 VIEW COMPARISON:  Jan 19, 2017 FINDINGS: Moderate severity bilateral multifocal infiltrates are seen. This is most prominent within the bilateral lung bases. There is no evidence of a pleural effusion or  pneumothorax. The heart size and mediastinal contours are within normal limits. The visualized skeletal structures are unremarkable. IMPRESSION: Moderate severity bilateral multifocal infiltrates, most prominent within the bilateral lung bases. Electronically Signed   By: Virgina Norfolk M.D.   On: 09/13/2020 18:27    EKG: Independently reviewed. Vent. rate 99 BPM PR interval * ms QRS duration 123 ms QT/QTc 398/511 ms P-R-T axes 38 -48 103 Sinus rhythm Atrial premature complex Left bundle branch block Baseline wander in lead(s) II III aVF  Assessment/Plan Principal Problem:   Pneumonia due to COVID-19 virus Continue supplemental oxygen. Incentive spirometry and flutter valve. Bronchodilators as needed. Analgesics as needed. Antiemetics as needed. Antitussives as needed. Remdesivir per pharmacy. Dexamethasone 6 mg IVP daily. Actemra per pharmacy. Vitamin D 50,000 units x 1 dose. Monitor CBC, CMP and inflammatory markers.  Active Problems:   Type 2 diabetes mellitus (HCC) Carbohydrate  modified diet. Tradjenta per Covid protocol. Levemir per Covid protocol. CBG monitoring with RI SS before meals.    Hypertension Monitor blood pressure. As needed antihypertensive.    Class 1 obesity Lifestyle modification Follow-up with PCP.    Protein-calorie malnutrition, severe (HCC) Poor prognostic factor for Covid. Protein supplementation 3 times daily.    Peripheral neuropathy Continue Neurontin 900 mg p.o. daily.    DVT prophylaxis: Lovenox SQ. Code Status:   Full code. Family Communication: Disposition Plan:   Patient is from:  Home.  Anticipated DC to:  Home.  Anticipated DC date:  09/13/2020.  Anticipated DC barriers: Clinical status.  Consults called: Admission status:  Stepdown/observation.   Severity of Illness: Very high due to significant oxygen requirement of 50 LPM on HF [place] in the setting of COVID-19 pneumonia. The patient will need to remain hospitalized until his oxygen requirement normalizes or at least decreases significantly.  Reubin Milan MD [facility]  How to contact the [facility] Attending or Consulting provider [place] or covering provider during after hours [place], for this patient?   1. Check the care team in [facility] and look for a) attending/consulting TR[facility] provider listed and b) the [facility] team listed 2. Log into [email or web address] and use [place]'s universal password to access. If you do not have the password, please contact the hospital operator. 3. Locate the [facility] provider you are looking for under [facility] and page to a number that you can be directly reached. 4. If you still have difficulty reaching the provider, please page the [facility] (Director on Call) for the Hospitalists listed on amion for assistance.  09/09/2020, 10:25 PM   This document was prepared using Dragon voice recognition software and may contain some unintended transcription errors.

## 2020-09-10 NOTE — ED Provider Notes (Signed)
[facility] Emergency Department Provider Note   ____________________________________________   Event Date/Time   First MD Initiated Contact with Patient 2020/10/10 1750     (approximate)  I have reviewed the triage vital signs and the nursing notes.   HISTORY  Chief Complaint Respiratory Distress  EM caveat:  respiratory distress  HPI Steven Gallegos is a 75 y.o. male history of diabetes   EMS reports patient's been having about 1 week of symptoms.  Patient tells me he started with "flulike" symptoms a week ago.  Including now developing severe shortness of breath for the last couple of days and severe weakness to the point he cannot walk on his own  Patient denies chest pain.  Shortness of breath improving on oxygen.  Denies history of lung disease or COPD  He has not been compliant with his diabetes medication for the last few days has not been eating or drinking anything    Past Medical History:  Diagnosis Date  . Class 1 obesity   . Hypertension   . Osteoarthritis   . Peripheral neuropathy   . Spinal stenosis   . Type 2 diabetes mellitus (HCC) October 10, 2020    Patient Active Problem List   Diagnosis Date Noted  . Pneumonia due to COVID-19 virus 10/10/20  . Type 2 diabetes mellitus (HCC) 10-10-20  . Hypertension   . Class 1 obesity   . Peripheral neuropathy   . Diabetic ketoacidosis (HCC) 01/04/2015  . Acute renal failure (HCC) 01/04/2015  . Tachycardia 01/03/2015    Past Surgical History:  Procedure Laterality Date  . BACK SURGERY      Prior to Admission medications   Medication Sig Start Date End Date Taking? Authorizing Provider  albuterol (PROVENTIL HFA;VENTOLIN HFA) 108 (90 Base) MCG/ACT inhaler Inhale 2 puffs into the lungs every 6 (six) hours as needed for wheezing or shortness of breath. 05/29/16   Gayla Doss, MD  aspirin EC 81 MG EC tablet Take 1 tablet (81 mg total) by mouth daily. 01/05/15   Auburn Bilberry, MD   cyanocobalamin 500 MCG tablet Take 1,000 mcg by mouth daily.    [provider]  Ergocalciferol (VITAMIN D2) 2000 UNITS TABS Take 1 tablet by mouth daily.    [provider]  gabapentin (NEURONTIN) 300 MG capsule Take 900 mg by mouth at bedtime.    [provider]  insulin aspart protamine- aspart (NOVOLOG MIX 70/30) (70-30) 100 UNIT/ML injection Inject 45-60 Units into the skin 2 (two) times daily with a meal. 60 units Am, 45 units PM    [provider]  insulin detemir (LEVEMIR) 100 UNIT/ML injection Inject 25 Units into the skin at bedtime. 01/05/15   [provider]  Multiple Vitamins-Minerals (ONE-A-DAY MENS HEALTH FORMULA PO) Take 1 tablet by mouth daily.    [provider]  omeprazole (PRILOSEC) 20 MG capsule Take 20 mg by mouth daily.    [provider]    Allergies Flexeril [cyclobenzaprine]  History reviewed. No pertinent family history.  Social History Social History   Tobacco Use  . Smoking status: Former Games developer  . Smokeless tobacco: Never Used  Substance Use Topics  . Alcohol use: No  . Drug use: No    Review of Systems EM caveat   ____________________________________________   PHYSICAL EXAM:  VITAL SIGNS: ED Triage Vitals [10-Oct-2020 1751]  Enc Vitals Group     BP      Pulse      Resp  Temp      Temp src      SpO2      Weight 264 lb (119.7 kg)     Height 6\' 4"  (1.93 m)     Head Circumference      Peak Flow      Pain Score      Pain Loc      Pain Edu?      Excl. in GC?     Constitutional: Alert and oriented.  Severely ill-appearing, patient alert but appears very dry fatigued and slightly cyanotic in his peripheral extremities.  He has moderate increased work of breathing and moderate use of accessory muscles Eyes: Conjunctivae are normal. Head: Atraumatic. Nose: No congestion/rhinnorhea. Mouth/Throat: Mucous membranes are bone dry Neck: No stridor.  Cardiovascular: Tachycardic  rate, regular rhythm. Grossly normal heart sounds.  Good peripheral circulation. Respiratory: No tachypnea, speaks in phrases.  Able to speak over nonrebreather saturations in the mid 90s on nonrebreather.  Lungs clear bilaterally without noted Rales crackles or wheezes.  Moderate increased work of breathing Gastrointestinal: Soft and nontender. No distention. Musculoskeletal: No lower extremity tenderness nor edema.  He has slow peripheral perfusion in all extremities capillary refill about 2 seconds Neurologic:  Normal speech and language. No gross focal neurologic deficits are appreciated.  Skin:  Skin is warm, dry and intact. No rash noted. Psychiatric: Mood and affect are normal. Speech and behavior are normal.  ____________________________________________   LABS (all labs ordered are listed, but only abnormal results are displayed)  Labs Reviewed  LACTIC ACID, PLASMA - Abnormal; Notable for the following components:      Result Value   Lactic Acid, Venous 2.9 (*)    All other components within normal limits  URINALYSIS, COMPLETE (UACMP) WITH MICROSCOPIC - Abnormal; Notable for the following components:   Color, Urine YELLOW (*)    APPearance HAZY (*)    Specific Gravity, Urine 1.036 (*)    Glucose, UA >=500 (*)    Hgb urine dipstick SMALL (*)    Ketones, ur 20 (*)    Protein, ur 100 (*)    All other components within normal limits  BLOOD GAS, VENOUS - Abnormal; Notable for the following components:   pCO2, Ven 37 (*)    Acid-base deficit 4.6 (*)    All other components within normal limits  COMPREHENSIVE METABOLIC PANEL - Abnormal; Notable for the following components:   Potassium 3.3 (*)    CO2 20 (*)    Glucose, Bld 330 (*)    BUN 29 (*)    Calcium 8.1 (*)    Total Protein 6.0 (*)    Albumin 2.3 (*)    AST 46 (*)    Anion gap 16 (*)    All other components within normal limits  POC SARS CORONAVIRUS 2 AG -  ED - Abnormal; Notable for the following components:   SARS  Coronavirus 2 Ag POSITIVE (*)    All other components within normal limits  CBG MONITORING, ED - Abnormal; Notable for the following components:   Glucose-Capillary 412 (*)    All other components within normal limits  CBG MONITORING, ED - Abnormal; Notable for the following components:   Glucose-Capillary 335 (*)    All other components within normal limits  TROPONIN I (HIGH SENSITIVITY) - Abnormal; Notable for the following components:   Troponin I (High Sensitivity) 21 (*)    All other components within normal limits  CULTURE, BLOOD (ROUTINE X 2)  CULTURE, BLOOD (  ROUTINE X 2)  URINE CULTURE  CBC WITH DIFFERENTIAL/PLATELET  PROTIME-INR  APTT  BRAIN NATRIURETIC PEPTIDE  LACTIC ACID, PLASMA  C-REACTIVE PROTEIN  FIBRIN DERIVATIVES D-DIMER ([facility] ONLY)  FIBRINOGEN  FERRITIN  PROCALCITONIN  MAGNESIUM  PHOSPHORUS  FERRITIN  FIBRIN DERIVATIVES D-DIMER ([facility] ONLY)  C-REACTIVE PROTEIN  COMPREHENSIVE METABOLIC PANEL  CBC WITH DIFFERENTIAL/PLATELET  LACTATE DEHYDROGENASE  MAGNESIUM  PHOSPHORUS  TROPONIN I (HIGH SENSITIVITY)   ____________________________________________  EKG  Reviewed interpreted by me at 1750 Heart rate 100 QRS 120 QTc 500 Sinus tachycardia, left bundle branch block like morphology. ____________________________________________  RADIOLOGY  DG Chest Port 1 View  Result Date: 2020/09/19 CLINICAL DATA:  Feeling bad for 1 week. EXAM: PORTABLE CHEST 1 VIEW COMPARISON:  Jan 19, 2017 FINDINGS: Moderate severity bilateral multifocal infiltrates are seen. This is most prominent within the bilateral lung bases. There is no evidence of a pleural effusion or pneumothorax. The heart size and mediastinal contours are within normal limits. The visualized skeletal structures are unremarkable. IMPRESSION: Moderate severity bilateral multifocal infiltrates, most prominent within the bilateral lung bases. Electronically Signed   By: Virgina Norfolk M.D.   On: 2020/09/19 18:27      Chest x-ray reviewed concerning for bilateral multifocal infiltrates ____________________________________________   PROCEDURES  Procedure(s) performed: None  Procedures  Critical Care performed: Yes, see critical care note(s)  CRITICAL CARE Performed by: Delman Kitten   Total critical care time: 40 minutes  Critical care time was exclusive of separately billable procedures and treating other patients.  Critical care was necessary to treat or prevent imminent or life-threatening deterioration.  Critical care was time spent personally by me on the following activities: development of treatment plan with patient and/or surrogate as well as nursing, discussions with consultants, evaluation of patient's response to treatment, examination of patient, obtaining history from patient or surrogate, ordering and performing treatments and interventions, ordering and review of laboratory studies, ordering and review of radiographic studies, pulse oximetry and re-evaluation of patient's condition.  ____________________________________________   INITIAL IMPRESSION / ASSESSMENT AND PLAN / ED COURSE  Pertinent labs & imaging results that were available during my care of the patient were reviewed by me and considered in my medical decision making (see chart for details).   Patient presents for 1 week of symptoms now with significant shortness of breath and weakness starting with symptoms patient first thought were flulike.  I am highly suspicious this could be COVID-19 or potentially pulmonary infection given the presentation though his differential diagnosis remains extremely broad at this time.  Will work-up broadly, since code sepsis initiated based on poor peripheral perfusion, hypoxia, work of breathing and the clinical history provided.  Initiate empiric antibiotics targeted at pulmonary infection early on.  Patient denies chest pain.  Does appear somewhat peripherally cool and poorly  perfused in his fingertips and nailbeds.  Fluid resuscitation ordered    ----------------------------------------- 8:41 PM on 09-19-20 -----------------------------------------  Patient tolerating high flow nasal cannula well, resting comfortably.  Alerts easily to voice.  Respiratory distress improving.  Discussed and patient admitted to hospitalist Dr. Olevia Bowens.  COVID-19 with acute hypoxia  ____________________________________________   FINAL CLINICAL IMPRESSION(S) / ED DIAGNOSES  Final diagnoses:  COVID-19  Acute respiratory failure with hypoxia ([place])        Note:  This document was prepared using Dragon voice recognition software and may include unintentional dictation errors       Delman Kitten, MD September 19, 2020 2041

## 2020-09-11 DIAGNOSIS — J1282 Pneumonia due to coronavirus disease 2019: Secondary | ICD-10-CM | POA: Diagnosis present

## 2020-09-11 DIAGNOSIS — Z79899 Other long term (current) drug therapy: Secondary | ICD-10-CM | POA: Diagnosis not present

## 2020-09-11 DIAGNOSIS — Z7982 Long term (current) use of aspirin: Secondary | ICD-10-CM | POA: Diagnosis not present

## 2020-09-11 DIAGNOSIS — E669 Obesity, unspecified: Secondary | ICD-10-CM | POA: Diagnosis not present

## 2020-09-11 DIAGNOSIS — R6521 Severe sepsis with septic shock: Secondary | ICD-10-CM | POA: Diagnosis not present

## 2020-09-11 DIAGNOSIS — I4891 Unspecified atrial fibrillation: Secondary | ICD-10-CM | POA: Diagnosis present

## 2020-09-11 DIAGNOSIS — Z66 Do not resuscitate: Secondary | ICD-10-CM | POA: Diagnosis not present

## 2020-09-11 DIAGNOSIS — I10 Essential (primary) hypertension: Secondary | ICD-10-CM | POA: Diagnosis present

## 2020-09-11 DIAGNOSIS — E875 Hyperkalemia: Secondary | ICD-10-CM | POA: Diagnosis not present

## 2020-09-11 DIAGNOSIS — E1142 Type 2 diabetes mellitus with diabetic polyneuropathy: Secondary | ICD-10-CM | POA: Diagnosis present

## 2020-09-11 DIAGNOSIS — Z87891 Personal history of nicotine dependence: Secondary | ICD-10-CM | POA: Diagnosis not present

## 2020-09-11 DIAGNOSIS — Z515 Encounter for palliative care: Secondary | ICD-10-CM | POA: Diagnosis not present

## 2020-09-11 DIAGNOSIS — A4189 Other specified sepsis: Secondary | ICD-10-CM | POA: Diagnosis present

## 2020-09-11 DIAGNOSIS — Z7984 Long term (current) use of oral hypoglycemic drugs: Secondary | ICD-10-CM | POA: Diagnosis not present

## 2020-09-11 DIAGNOSIS — Z9114 Patient's other noncompliance with medication regimen: Secondary | ICD-10-CM | POA: Diagnosis not present

## 2020-09-11 DIAGNOSIS — U071 COVID-19: Secondary | ICD-10-CM | POA: Diagnosis present

## 2020-09-11 DIAGNOSIS — Z6832 Body mass index (BMI) 32.0-32.9, adult: Secondary | ICD-10-CM | POA: Diagnosis not present

## 2020-09-11 DIAGNOSIS — F419 Anxiety disorder, unspecified: Secondary | ICD-10-CM | POA: Diagnosis present

## 2020-09-11 DIAGNOSIS — J80 Acute respiratory distress syndrome: Secondary | ICD-10-CM | POA: Diagnosis present

## 2020-09-11 DIAGNOSIS — G928 Other toxic encephalopathy: Secondary | ICD-10-CM | POA: Diagnosis not present

## 2020-09-11 DIAGNOSIS — Z794 Long term (current) use of insulin: Secondary | ICD-10-CM | POA: Diagnosis not present

## 2020-09-11 DIAGNOSIS — E1165 Type 2 diabetes mellitus with hyperglycemia: Secondary | ICD-10-CM | POA: Diagnosis present

## 2020-09-11 DIAGNOSIS — N170 Acute kidney failure with tubular necrosis: Secondary | ICD-10-CM | POA: Diagnosis present

## 2020-09-11 DIAGNOSIS — E43 Unspecified severe protein-calorie malnutrition: Secondary | ICD-10-CM | POA: Diagnosis present

## 2020-09-11 DIAGNOSIS — J9601 Acute respiratory failure with hypoxia: Secondary | ICD-10-CM | POA: Diagnosis present

## 2020-09-11 DIAGNOSIS — I255 Ischemic cardiomyopathy: Secondary | ICD-10-CM | POA: Diagnosis present

## 2020-09-11 LAB — CBG MONITORING, ED
Glucose-Capillary: 202 mg/dL — ABNORMAL HIGH (ref 70–99)
Glucose-Capillary: 278 mg/dL — ABNORMAL HIGH (ref 70–99)
Glucose-Capillary: 265 mg/dL — ABNORMAL HIGH (ref 70–99)
Glucose-Capillary: 329 mg/dL — ABNORMAL HIGH (ref 70–99)

## 2020-09-11 LAB — CBC WITH DIFFERENTIAL/PLATELET
Abs Immature Granulocytes: 0.04 10*3/uL (ref 0.00–0.07)
Basophils Absolute: 0 10*3/uL (ref 0.0–0.1)
Basophils Relative: 0 %
Eosinophils Absolute: 0 10*3/uL (ref 0.0–0.5)
Eosinophils Relative: 0 %
HCT: 43.6 % (ref 39.0–52.0)
Hemoglobin: 15.1 g/dL (ref 13.0–17.0)
Immature Granulocytes: 1 %
Lymphocytes Relative: 13 %
Lymphs Abs: 0.8 10*3/uL (ref 0.7–4.0)
MCH: 29.4 pg (ref 26.0–34.0)
MCHC: 34.6 g/dL (ref 30.0–36.0)
MCV: 85 fL (ref 80.0–100.0)
Monocytes Absolute: 0.2 10*3/uL (ref 0.1–1.0)
Monocytes Relative: 3 %
Neutro Abs: 5.3 10*3/uL (ref 1.7–7.7)
Neutrophils Relative %: 83 %
Platelets: 223 10*3/uL (ref 150–400)
RBC: 5.13 MIL/uL (ref 4.22–5.81)
RDW: 12.5 % (ref 11.5–15.5)
Smear Review: NORMAL
WBC: 6.3 10*3/uL (ref 4.0–10.5)
nRBC: 0 % (ref 0.0–0.2)

## 2020-09-11 LAB — COMPREHENSIVE METABOLIC PANEL
ALT: 33 U/L (ref 0–44)
AST: 47 U/L — ABNORMAL HIGH (ref 15–41)
Albumin: 2.5 g/dL — ABNORMAL LOW (ref 3.5–5.0)
Alkaline Phosphatase: 90 U/L (ref 38–126)
Anion gap: 9 (ref 5–15)
BUN: 25 mg/dL — ABNORMAL HIGH (ref 8–23)
CO2: 23 mmol/L (ref 22–32)
Calcium: 8.4 mg/dL — ABNORMAL LOW (ref 8.9–10.3)
Chloride: 113 mmol/L — ABNORMAL HIGH (ref 98–111)
Creatinine, Ser: 0.54 mg/dL — ABNORMAL LOW (ref 0.61–1.24)
GFR, Estimated: >60 mL/min (ref >60)
Glucose, Bld: 197 mg/dL — ABNORMAL HIGH (ref 70–99)
Potassium: 3.9 mmol/L (ref 3.5–5.1)
Sodium: 145 mmol/L (ref 135–145)
Total Bilirubin: 1 mg/dL (ref 0.3–1.2)
Total Protein: 6.3 g/dL — ABNORMAL LOW (ref 6.5–8.1)

## 2020-09-11 LAB — PHOSPHORUS: Phosphorus: 2.3 mg/dL — ABNORMAL LOW (ref 2.5–4.6)

## 2020-09-11 LAB — BLOOD CULTURE ID PANEL (REFLEXED) - BCID2

## 2020-09-11 LAB — LACTIC ACID, PLASMA: Lactic Acid, Venous: 1.9 mmol/L (ref 0.5–1.9)

## 2020-09-11 LAB — MAGNESIUM
Magnesium: 2.4 mg/dL (ref 1.7–2.4)
Magnesium: 2.4 mg/dL (ref 1.7–2.4)

## 2020-09-11 LAB — C-REACTIVE PROTEIN
CRP: 21.9 mg/dL — ABNORMAL HIGH (ref <1.0)
CRP: 19.6 mg/dL — ABNORMAL HIGH (ref <1.0)

## 2020-09-11 LAB — D-DIMER, QUANTITATIVE: D-Dimer, Quant: 2.33 ug/mL-FEU — ABNORMAL HIGH (ref 0.00–0.50)

## 2020-09-11 LAB — PROCALCITONIN: Procalcitonin: <0.1 ng/mL

## 2020-09-11 LAB — FERRITIN: Ferritin: 880 ng/mL — ABNORMAL HIGH (ref 24–336)

## 2020-09-11 MED ORDER — TOCILIZUMAB 400 MG/20ML IV SOLN
800.0000 mg | Freq: Once | INTRAVENOUS | Status: AC
  Administered 2020-09-11: 800 mg via INTRAVENOUS
  Filled 2020-09-11: qty 40

## 2020-09-11 MED ORDER — K PHOS MONO-SOD PHOS DI & MONO 155-852-130 MG PO TABS
500.0000 mg | ORAL_TABLET | Freq: Three times a day (TID) | ORAL | Status: AC
  Administered 2020-09-11: 500 mg via ORAL
  Filled 2020-09-11 (×2): qty 2

## 2020-09-11 MED ORDER — VITAMIN B-12 1000 MCG PO TABS
1000.0000 ug | ORAL_TABLET | Freq: Every day | ORAL | Status: DC
  Administered 2020-09-11 – 2020-09-15 (×5): 1000 ug via ORAL
  Filled 2020-09-11 (×7): qty 1

## 2020-09-11 MED ORDER — ENSURE ENLIVE PO LIQD: 237.0000 mL | Freq: Two times a day (BID) | ORAL | Status: DC

## 2020-09-11 MED ORDER — PANTOPRAZOLE SODIUM 40 MG PO TBEC
40.0000 mg | DELAYED_RELEASE_TABLET | Freq: Every day | ORAL | Status: DC
  Administered 2020-09-11 – 2020-09-12 (×2): 40 mg via ORAL
  Filled 2020-09-11 (×2): qty 1

## 2020-09-11 MED ORDER — GABAPENTIN 300 MG PO CAPS
900.0000 mg | ORAL_CAPSULE | Freq: Every day | ORAL | Status: DC
  Administered 2020-09-11 – 2020-09-14 (×2): 900 mg via ORAL
  Filled 2020-09-11 (×4): qty 3

## 2020-09-11 MED ORDER — VITAMIN D (ERGOCALCIFEROL) 1.25 MG (50000 UNIT) PO CAPS
50000.0000 [IU] | ORAL_CAPSULE | ORAL | Status: DC
  Administered 2020-09-11: 50000 [IU] via ORAL
  Filled 2020-09-11: qty 1

## 2020-09-11 MED ORDER — INSULIN ASPART 100 UNIT/ML ~~LOC~~ SOLN
6.0000 [IU] | Freq: Three times a day (TID) | SUBCUTANEOUS | Status: DC
  Administered 2020-09-11: 6 [IU] via SUBCUTANEOUS
  Filled 2020-09-11 (×2): qty 1

## 2020-09-11 MED ORDER — ASPIRIN EC 81 MG PO TBEC
81.0000 mg | DELAYED_RELEASE_TABLET | Freq: Every day | ORAL | Status: DC
  Administered 2020-09-11 – 2020-09-15 (×5): 81 mg via ORAL
  Filled 2020-09-11 (×8): qty 1

## 2020-09-11 NOTE — Progress Notes (Signed)
Inpatient Diabetes Program Recommendations  AACE/ADA: New Consensus Statement on Inpatient Glycemic Control (2015)  Target Ranges:  Prepandial:   less than 140 mg/dL      Peak postprandial:   less than 180 mg/dL (1-2 hours)      Critically ill patients:  140 - 180 mg/dL   Lab Results  Component Value Date   GLUCAP 278 (H) 09/11/2020   HGBA1C 10.8 (H) 01/03/2015    Review of Glycemic Control Results for CHIRON, CAMPIONE (MRN [id number]) as of 09/11/2020 13:07  Ref. Range 09/05/2020 17:48 09/20/2020 20:32 09/09/2020 21:44 09/11/2020 09:25 09/11/2020 12:28  Glucose-Capillary Latest Ref Range: 70 - 99 mg/dL 045 (H) 997 (H) 741 (H) 202 (H) 278 (H)   Diabetes history: DM 2 Outpatient Diabetes medications:  Novolog mix 70/30 60 units q AM and 45 units q PM ?Levemir 25 units daily Current orders for Inpatient glycemic control:  Novolog 0-20 units tid with meals, Levemir 18 units bid, Tradjenta 5 mg daily Decadron 6 mg daily Inpatient Diabetes Program Recommendations:    Please consider adding Novolog 6 units tid with meals.  Continue to titrate Levemir based on fasting CBG's.    Thanks  Beryl Meager, RN, BC-ADM Inpatient Diabetes Coordinator Pager [phone] (8a-5p)

## 2020-09-11 NOTE — Progress Notes (Signed)
PHARMACY - PHYSICIAN COMMUNICATION CRITICAL VALUE ALERT - BLOOD CULTURE IDENTIFICATION (BCID)  Steven Gallegos is an 75 y.o. male who presented to [facility] on Sep 18, 2020 with a chief complaint of "feeling bad."  Assessment:  Patient with COVID, shortness of breath.  1/6 Bcx 1 of 4 GPC, BCID staphylococcus species.    Name of physician (or Provider) Contacted: Dr. Lucianne Muss  Current antibiotics: none   Changes to prescribed antibiotics recommended:  Recommendations accepted by provider - monitor off antibiotics as suspect contaminant  Results for orders placed or performed during the hospital encounter of 09-18-20  Blood Culture ID Panel (Reflexed) (Collected: 09-18-20  5:57 PM)  Result Value Ref Range   Enterococcus faecalis NOT DETECTED NOT DETECTED   Enterococcus Faecium NOT DETECTED NOT DETECTED   Listeria monocytogenes NOT DETECTED NOT DETECTED   Staphylococcus species DETECTED (A) NOT DETECTED   Staphylococcus aureus (BCID) NOT DETECTED NOT DETECTED   Staphylococcus epidermidis NOT DETECTED NOT DETECTED   Staphylococcus lugdunensis NOT DETECTED NOT DETECTED   Streptococcus species NOT DETECTED NOT DETECTED   Streptococcus agalactiae NOT DETECTED NOT DETECTED   Streptococcus pneumoniae NOT DETECTED NOT DETECTED   Streptococcus pyogenes NOT DETECTED NOT DETECTED   A.calcoaceticus-baumannii NOT DETECTED NOT DETECTED   Bacteroides fragilis NOT DETECTED NOT DETECTED   Enterobacterales NOT DETECTED NOT DETECTED   Enterobacter cloacae complex NOT DETECTED NOT DETECTED   Escherichia coli NOT DETECTED NOT DETECTED   Klebsiella aerogenes NOT DETECTED NOT DETECTED   Klebsiella oxytoca NOT DETECTED NOT DETECTED   Klebsiella pneumoniae NOT DETECTED NOT DETECTED   Proteus species NOT DETECTED NOT DETECTED   Salmonella species NOT DETECTED NOT DETECTED   Serratia marcescens NOT DETECTED NOT DETECTED   Haemophilus influenzae NOT DETECTED NOT DETECTED   Neisseria meningitidis NOT  DETECTED NOT DETECTED   Pseudomonas aeruginosa NOT DETECTED NOT DETECTED   Stenotrophomonas maltophilia NOT DETECTED NOT DETECTED   Candida albicans NOT DETECTED NOT DETECTED   Candida auris NOT DETECTED NOT DETECTED   Candida glabrata NOT DETECTED NOT DETECTED   Candida krusei NOT DETECTED NOT DETECTED   Candida parapsilosis NOT DETECTED NOT DETECTED   Candida tropicalis NOT DETECTED NOT DETECTED   Cryptococcus neoformans/gattii NOT DETECTED NOT DETECTED    Juliette Alcide, PharmD, BCPS.   Work Cell: [phone] 09/11/2020 2:12 PM

## 2020-09-11 NOTE — Progress Notes (Signed)
SLP Cancellation Note  Patient Details Name: Steven Gallegos MRN: [id number] DOB: 1946/01/27   Cancelled treatment:       Reason Eval/Treat Not Completed: SLP screened, no needs identified, will sign off  Chart review and nursing consulted to confirm no acute s/s of oropharyngeal dysphagia at bedside. Current diet appears appropriate at this time.   Aletha Allebach B. Dreama Saa M.S., CCC-SLP, [facility] Speech-Language Pathologist Rehabilitation Services Office [phone]  Reuel Derby 09/11/2020, 1:06 PM

## 2020-09-11 NOTE — Progress Notes (Signed)
[facility] Progress Note  Patient: Steven Gallegos    BPZ:[id number]  DOA: September 17, 2020     Date of Service: Steven patient was seen and examined on 09/11/2020  Chief Complaint  Patient presents with  . Respiratory Distress   Brief hospital course: Steven Gallegos is a 75 y.o. male with medical history significant of class I obesity, hypertension, osteoarthritis, diabetic peripheral neuropathy, type 2 diabetes, spinal stenosis who is coming to Steven emergency department with complaints of progressively worse dyspnea, fever, chills, night sweats, fatigue, malaise, myalgias/arthralgias, decreased appetite, sore throat, nonproductive cough for about a week. He has no known exposures to COVID-19 and had Steven Steven Gallegos vaccine back in September. Denies chest pain, palpitations, dizziness, diaphoresis, PND orthopnea. No abdominal pain, nausea, vomiting, diarrhea, constipation, melena or hematochezia. Denies dysuria, frequency or materia. Denies polyuria, polydipsia, polyphagia or blurred vision.  ED Course: Initial vital signs were temperature 98.1 F, pulse 99, respirations 30, BP 132/84 mmHg O2 sat 92% and normal high flow nasal cannula. Steven patient received a 1000 mL LR bolus in Steven ED. I added another 1000 mL of LR bolus, 20 units of insulin SQ and begun COVID-19 treatment protocol.  Labwork: Urinalysis showed glucosuria more than 500, ketonuria 20 and proteinuria 100 mg/dL. CBC was normal with a hemoglobin of 17.0 g/dL. PT/INR/PTT within expected values. Venous blood gas showed a pH of 7.35, PCO2 of 37 mmHg acid base deficit 4.6 mmol/L. Lactic acid was 2.9 then 6.4 than 4.2 mmol/L. Magnesium level was not added on and drawn while Steven patient was receiving Steven infusion and resulted in 12.5 mg/dL. Phosphorus and corrected to albumin and calcium were normal. Potassium is 3.3, and CO2 20 mmol/L. Anion gap was 16. Glucose 330, BUN 29 and creatinine 0.88 mg/dL. Total protein 6.0, albumin 2.3  g/dL.   Assessment and Plan: Principal Problem:   Pneumonia due to COVID-19 virus Patient is unvaccinated Continue supplemental oxygen. Incentive spirometry and flutter valve. Bronchodilators as needed. Analgesics as needed. Antiemetics as needed. Antitussives as needed. Remdesivir per pharmacy. Dexamethasone 6 mg IVP daily. Actemra per pharmacy. Vitamin D 50,000 units x 1 dose. Monitor CBC, CMP and inflammatory markers. Blood culture 1/4 growing GPC possible contamination Antibiotic discontinued, we will continue to follow final report of blood culture    Active Problems:   Type 2 diabetes mellitus (HCC) Carbohydrate modified diet. Tradjenta per Covid protocol. Levemir per Covid protocol. CBG monitoring with RI SS before meals.    Hypertension Monitor blood pressure. As needed antihypertensive.    Class 1 obesity Lifestyle modification Follow-up with PCP.    Protein-calorie malnutrition, severe (HCC) Poor prognostic factor for Covid. Protein supplementation 3 times daily.    Peripheral neuropathy Continue Neurontin 900 mg p.o. daily.   Body mass index is 32.14 kg/m.        Diet: Heart healthy/carb modified DVT Prophylaxis: Subcutaneous Lovenox   Advance goals of care discussion: Full code  Family Communication: family was not present at bedside, at Steven time of interview.  Steven pt provided permission to discuss medical plan with Steven family. Opportunity was given to ask question and all questions were answered satisfactorily.   Disposition:  Pt is from Home, admitted with acute hyper respiratory failure secondary to COVID-19 pneumonia, still has severe respiratory failure, which precludes a safe discharge. Discharge to home versus SNF depends on recovery, when recovers from COVID-19 pneumonia.  Subjective: Patient was admitted due to acute hyper respiratory failure due to COVID-19 pneumonia, still has  significant shortness of breath on heated high  flow with nasal cannula.  Patient denies any chest pain or palpitations, no abdominal pain, no nausea vomiting or diarrhea. Patient was advised that improvement in his breathing will take significant time due to severe respiratory failure   Physical Exam: General:  alert oriented to time, place, and person.  Appear in moderate distress, affect appropriate Eyes: PERRLA ENT: Oral Mucosa Clear, moist  Neck: no JVD,  Cardiovascular: S1 and S2 Present, no Murmur,  Respiratory: increased respiratory effort, Bilateral Air entry equal and Decreased, significant bilateral crackles, no wheezing appreciated.  Abdomen: Bowel Sound present, Soft and no tenderness,  Skin: no Rashes Extremities: no Pedal edema, no calf tenderness Neurologic: without any new focal findings Gait not checked due to patient safety concerns  Vitals:   09/11/20 0900 09/11/20 0930 09/11/20 1200 09/11/20 1215  BP: (!) 149/91  (!) 136/91   Pulse: 78 95 94 92  Resp: (!) 24 18 (!) 33 (!) 25  Temp:      TempSrc:      SpO2: 100% (!) 88% (!) 86% 100%  Weight:      Height:        Intake/Output Summary (Last 24 hours) at 09/11/2020 1543 Last data filed at 09/11/2020 1231 Gross per 24 hour  Intake 2910.09 ml  Output --  Net 2910.09 ml   Filed Weights   09-14-2020 1751  Weight: 119.7 kg    Data Reviewed: I have personally reviewed and interpreted daily labs, tele strips, imagings as discussed above. I reviewed all nursing notes, pharmacy notes, vitals, pertinent old records I have discussed plan of care as described above with RN and patient/family.  CBC: Recent Labs  Lab 09-14-2020 1752 09/11/20 0422  WBC 8.5 6.3  NEUTROABS 7.5 5.3  HGB 17.0 15.1  HCT 49.6 43.6  MCV 86.1 85.0  PLT 259 223   Basic Metabolic Panel: Recent Labs  Lab Sep 14, 2020 1925 2020-09-14 2146 09/11/20 0038 09/11/20 0422  NA 144  --   --  145  K 3.3*  --   --  3.9  CL 108  --   --  113*  CO2 20*  --   --  23  GLUCOSE 330*  --   --  197*   BUN 29*  --   --  25*  CREATININE 0.88  --   --  0.54*  CALCIUM 8.1*  --   --  8.4*  MG  --  12.5* 2.4 2.4  PHOS  --  2.7  --  2.3*    Studies: DG Chest Port 1 View  Result Date: 14-Sep-2020 CLINICAL DATA:  Feeling bad for 1 week. EXAM: PORTABLE CHEST 1 VIEW COMPARISON:  Jan 19, 2017 FINDINGS: Moderate severity bilateral multifocal infiltrates are seen. This is most prominent within Steven bilateral lung bases. There is no evidence of a pleural effusion or pneumothorax. Steven heart size and mediastinal contours are within normal limits. Steven visualized skeletal structures are unremarkable. IMPRESSION: Moderate severity bilateral multifocal infiltrates, most prominent within Steven bilateral lung bases. Electronically Signed   By: Aram Candela M.D.   On: 2020-09-14 18:27    Scheduled Meds: . albuterol  2 puff Inhalation Q6H  . vitamin C  500 mg Oral Daily  . aspirin EC  81 mg Oral Daily  . dexamethasone (DECADRON) injection  6 mg Intravenous Q24H  . enoxaparin (LOVENOX) injection  0.5 mg/kg Subcutaneous Q24H  . gabapentin  900 mg Oral QHS  .  insulin aspart  0-20 Units Subcutaneous TID WC  . insulin aspart  6 Units Subcutaneous TID WC  . insulin detemir  0.15 Units/kg Subcutaneous BID  . linagliptin  5 mg Oral Daily  . pantoprazole  40 mg Oral Daily  . phosphorus  500 mg Oral TID  . vitamin B-12  1,000 mcg Oral Daily  . Vitamin D (Ergocalciferol)  50,000 Units Oral Q7 days  . zinc sulfate  220 mg Oral Daily   Continuous Infusions: . remdesivir 100 mg in NS 100 mL Stopped (09/11/20 1101)   PRN Meds: acetaminophen **OR** acetaminophen, chlorpheniramine-HYDROcodone, guaiFENesin-dextromethorphan, ondansetron **OR** ondansetron (ZOFRAN) IV  Time spent: 35 minutes  Author: Gillis Santa. MD [facility] 09/11/2020 3:43 PM  To reach On-call, see care teams to locate Steven attending and reach out to them via [email or web address]. If 7PM-7AM, please contact night-coverage If you still have  difficulty reaching Steven attending provider, please page [facility] (Director on Call) for [facility] on amion for assistance.

## 2020-09-12 DIAGNOSIS — U071 COVID-19: Secondary | ICD-10-CM | POA: Diagnosis not present

## 2020-09-12 DIAGNOSIS — J1282 Pneumonia due to coronavirus disease 2019: Secondary | ICD-10-CM | POA: Diagnosis not present

## 2020-09-12 LAB — CBC WITH DIFFERENTIAL/PLATELET
Abs Immature Granulocytes: 0.04 10*3/uL (ref 0.00–0.07)
Basophils Absolute: 0 10*3/uL (ref 0.0–0.1)
Basophils Relative: 0 %
Eosinophils Absolute: 0 10*3/uL (ref 0.0–0.5)
Eosinophils Relative: 0 %
HCT: 42.7 % (ref 39.0–52.0)
Hemoglobin: 14.8 g/dL (ref 13.0–17.0)
Immature Granulocytes: 1 %
Lymphocytes Relative: 13 %
Lymphs Abs: 0.9 10*3/uL (ref 0.7–4.0)
MCH: 29.5 pg (ref 26.0–34.0)
MCHC: 34.7 g/dL (ref 30.0–36.0)
MCV: 85.2 fL (ref 80.0–100.0)
Monocytes Absolute: 0.2 10*3/uL (ref 0.1–1.0)
Monocytes Relative: 3 %
Neutro Abs: 6.1 10*3/uL (ref 1.7–7.7)
Neutrophils Relative %: 83 %
Platelets: 167 10*3/uL (ref 150–400)
RBC: 5.01 MIL/uL (ref 4.22–5.81)
RDW: 12.8 % (ref 11.5–15.5)
WBC: 7.3 10*3/uL (ref 4.0–10.5)
nRBC: 0 % (ref 0.0–0.2)

## 2020-09-12 LAB — CBG MONITORING, ED
Glucose-Capillary: 268 mg/dL — ABNORMAL HIGH (ref 70–99)
Glucose-Capillary: 276 mg/dL — ABNORMAL HIGH (ref 70–99)
Glucose-Capillary: 302 mg/dL — ABNORMAL HIGH (ref 70–99)
Glucose-Capillary: 292 mg/dL — ABNORMAL HIGH (ref 70–99)
Glucose-Capillary: 293 mg/dL — ABNORMAL HIGH (ref 70–99)

## 2020-09-12 LAB — COMPREHENSIVE METABOLIC PANEL
ALT: 30 U/L (ref 0–44)
AST: 41 U/L (ref 15–41)
Albumin: 2.4 g/dL — ABNORMAL LOW (ref 3.5–5.0)
Alkaline Phosphatase: 119 U/L (ref 38–126)
Anion gap: 11 (ref 5–15)
BUN: 28 mg/dL — ABNORMAL HIGH (ref 8–23)
CO2: 23 mmol/L (ref 22–32)
Calcium: 8.4 mg/dL — ABNORMAL LOW (ref 8.9–10.3)
Chloride: 110 mmol/L (ref 98–111)
Creatinine, Ser: 0.66 mg/dL (ref 0.61–1.24)
GFR, Estimated: >60 mL/min (ref >60)
Glucose, Bld: 328 mg/dL — ABNORMAL HIGH (ref 70–99)
Potassium: 4 mmol/L (ref 3.5–5.1)
Sodium: 144 mmol/L (ref 135–145)
Total Bilirubin: 0.8 mg/dL (ref 0.3–1.2)
Total Protein: 5.9 g/dL — ABNORMAL LOW (ref 6.5–8.1)

## 2020-09-12 LAB — D-DIMER, QUANTITATIVE: D-Dimer, Quant: 18.61 ug/mL-FEU — ABNORMAL HIGH (ref 0.00–0.50)

## 2020-09-12 LAB — URINE CULTURE

## 2020-09-12 LAB — C-REACTIVE PROTEIN: CRP: 10.1 mg/dL — ABNORMAL HIGH (ref <1.0)

## 2020-09-12 LAB — MAGNESIUM: Magnesium: 2.2 mg/dL (ref 1.7–2.4)

## 2020-09-12 LAB — PHOSPHORUS: Phosphorus: 3.9 mg/dL (ref 2.5–4.6)

## 2020-09-12 LAB — FERRITIN: Ferritin: 846 ng/mL — ABNORMAL HIGH (ref 24–336)

## 2020-09-12 MED ORDER — FUROSEMIDE 10 MG/ML IJ SOLN
40.0000 mg | Freq: Two times a day (BID) | INTRAMUSCULAR | Status: DC
  Administered 2020-09-12 – 2020-09-13 (×2): 40 mg via INTRAVENOUS
  Filled 2020-09-12 (×2): qty 4

## 2020-09-12 MED ORDER — INSULIN ASPART 100 UNIT/ML ~~LOC~~ SOLN
10.0000 [IU] | Freq: Three times a day (TID) | SUBCUTANEOUS | Status: DC
  Administered 2020-09-12: 10 [IU] via SUBCUTANEOUS
  Filled 2020-09-12: qty 1

## 2020-09-12 MED ORDER — INSULIN DETEMIR 100 UNIT/ML ~~LOC~~ SOLN
26.0000 [IU] | Freq: Two times a day (BID) | SUBCUTANEOUS | Status: DC
  Administered 2020-09-12 – 2020-09-15 (×5): 26 [IU] via SUBCUTANEOUS
  Filled 2020-09-12 (×9): qty 0.26

## 2020-09-12 MED ORDER — FUROSEMIDE 10 MG/ML IJ SOLN
40.0000 mg | Freq: Once | INTRAMUSCULAR | Status: AC
  Administered 2020-09-12: 40 mg via INTRAVENOUS
  Filled 2020-09-12: qty 4

## 2020-09-12 NOTE — ED Notes (Signed)
Repositioned pt on side per pt request.

## 2020-09-12 NOTE — ED Notes (Signed)
Patient had removed BiPAP to get a drink of water. Oxygen saturations noted to drop into 40's over a period of a few minutes. [name] HR increased and work of breathing increased. Patient placed back on BiPAP and this RN remained bedside until patient's VS stabilized.  Steven Ivan, NP made aware.

## 2020-09-12 NOTE — ED Notes (Signed)
Steven Ivan, NP messaged via secure chat for a reevaluation of the patient d/t being inapropriate for a med-surg bed. Patient is on BiPAP. E. Ouma informed of patient's recent change in respiratory work and constant decreasing saturations prior to being on BiPAP.

## 2020-09-12 NOTE — ED Notes (Signed)
[name] work of breathing seems to have significantly improved. Patient noted to be resting. Rise and fall of chest noted.

## 2020-09-12 NOTE — ED Notes (Addendum)
Respiratory bedside to adjust HFNC. [name] O2 remains in high 80's.  Patient given cough syrup per request. Patient given meal tray.

## 2020-09-12 NOTE — ED Notes (Signed)
S. Marylu Lund, COVID attending messaged via secure chat to inform about patient's change of breathing status.

## 2020-09-12 NOTE — ED Notes (Signed)
For lunch ate most of the mashed potatoes.  Drinking fluids well.

## 2020-09-12 NOTE — ED Notes (Addendum)
O2 sats decreased to low 70's. Called RT who suggested to put a NRB mask on top of HFNC. O2 sats remain in 70's. [name] work of breathing has increased to resemble a gasp. He is complaining of not being able to breathe and hurting all over. RT informed again of not increasing sats, will come to evaluate patient. Will message Attending MD.

## 2020-09-12 NOTE — ED Notes (Signed)
Patient cleaned of large incontinent urine and large incontinent bowel episode. New linens, bed pan, and gown placed. Male pure wick replaced d/t previous falling off.  Mepilex placed on patient's bottom. [name] buttocks noticed to be red yet blanchable.

## 2020-09-12 NOTE — Progress Notes (Signed)
[facility] Progress Note  Patient: Steven Gallegos    YPP:[id number]  DOA: 09/12/2020     Date of Service: the patient was seen and examined on 09/12/2020  Chief Complaint  Patient presents with  . Respiratory Distress   Brief hospital course: Steven Gallegos is a 75 y.o. male with medical history significant of class I obesity, hypertension, osteoarthritis, diabetic peripheral neuropathy, type 2 diabetes, spinal stenosis who is coming to the emergency department with complaints of progressively worse dyspnea, fever, chills, night sweats, fatigue, malaise, myalgias/arthralgias, decreased appetite, sore throat, nonproductive cough for about a week. He has no known exposures to COVID-19 and had the [employer] vaccine back in September. Denies chest pain, palpitations, dizziness, diaphoresis, PND orthopnea. No abdominal pain, nausea, vomiting, diarrhea, constipation, melena or hematochezia. Denies dysuria, frequency or materia. Denies polyuria, polydipsia, polyphagia or blurred vision.  ED Course: Initial vital signs were temperature 98.1 F, pulse 99, respirations 30, BP 132/84 mmHg O2 sat 92% and normal high flow nasal cannula. The patient received a 1000 mL LR bolus in the ED. I added another 1000 mL of LR bolus, 20 units of insulin SQ and begun COVID-19 treatment protocol.  Labwork: Urinalysis showed glucosuria more than 500, ketonuria 20 and proteinuria 100 mg/dL. CBC was normal with a hemoglobin of 17.0 g/dL. PT/INR/PTT within expected values. Venous blood gas showed a pH of 7.35, PCO2 of 37 mmHg acid base deficit 4.6 mmol/L. Lactic acid was 2.9 then 6.4 than 4.2 mmol/L. Magnesium level was not added on and drawn while the patient was receiving the infusion and resulted in 12.5 mg/dL. Phosphorus and corrected to albumin and calcium were normal. Potassium is 3.3, and CO2 20 mmol/L. Anion gap was 16. Glucose 330, BUN 29 and creatinine 0.88 mg/dL. Total protein 6.0, albumin 2.3  g/dL.   Assessment and Plan: Principal Problem:   Pneumonia due to COVID-19 virus Patient is unvaccinated Continue supplemental oxygen. Incentive spirometry and flutter valve. Bronchodilators as needed. Analgesics as needed. Antiemetics as needed. Antitussives as needed. 1/6---1/10  Remdesivir for 5 days as per pharmacy. Dexamethasone 6 mg IVP daily. 1/6 s/p Actemra per pharmacy x 1 dose given Vitamin D 50,000 units x 1 dose. Monitor CBC, CMP and inflammatory markers. Blood culture 1/4 growing GPC possible contamination Antibiotic discontinued, we will continue to follow final report of blood culture  1/8 started Lasix 40 mg IV twice daily to diurese, monitor renal functions daily and electrolytes   Active Problems:   Type 2 diabetes mellitus (HCC) Carbohydrate modified diet. Tradjenta per Covid protocol. Levemir per Covid protocol. CBG monitoring with RI SS before meals.    Hypertension Monitor blood pressure. As needed antihypertensive.    Class 1 obesity Lifestyle modification Follow-up with PCP.    Protein-calorie malnutrition, severe (HCC) Poor prognostic factor for Covid. Protein supplementation 3 times daily.    Peripheral neuropathy Continue Neurontin 900 mg p.o. daily.   Body mass index is 32.14 kg/m.        Diet: Heart healthy/carb modified DVT Prophylaxis: Subcutaneous Lovenox   Advance goals of care discussion: Full code  Family Communication: family was not present at bedside, at the time of interview.  The pt provided permission to discuss medical plan with the family. Opportunity was given to ask question and all questions were answered satisfactorily.   Disposition:  Pt is from Home, admitted with acute hyper respiratory failure secondary to COVID-19 pneumonia, still has severe respiratory failure, which precludes a safe discharge. Discharge to  home versus SNF depends on recovery, when recovers from COVID-19 pneumonia.  Subjective:  No significant overnight events, patient feels a little bit improvement but is still has significant respiratory failure requiring HHFNC 60 L oxygen with 100% FiO2 h Patient denies any chest pain or palpitations. Condition discussed with pulmonary critical care who will keep an eye on this patient in case if patient's condition deteriorates.   Physical Exam: General:  alert oriented to time, place, and person.  Appear in moderate distress, affect appropriate Eyes: PERRLA ENT: Oral Mucosa Clear, moist  Neck: no JVD,  Cardiovascular: S1 and S2 Present, no Murmur,  Respiratory: increased respiratory effort, Bilateral Air entry equal and Decreased, significant bilateral crackles, no wheezing appreciated.  Abdomen: Bowel Sound present, Soft and no tenderness,  Skin: no Rashes Extremities: no Pedal edema, no calf tenderness Neurologic: without any new focal findings Gait not checked due to patient safety concerns  Vitals:   09/12/20 0430 09/12/20 0623 09/12/20 0930 09/12/20 1430  BP: 111/79 (!) 143/86 (!) 153/72 116/79  Pulse: 78  97 78  Resp: (!) 22 (!) 28 (!) 23 (!) 25  Temp:  97.8 F (36.6 C)    TempSrc:  Oral    SpO2: 90% 94% 98% 95%  Weight:      Height:        Intake/Output Summary (Last 24 hours) at 09/12/2020 1522 Last data filed at 09/11/2020 2030 Gross per 24 hour  Intake --  Output 800 ml  Net -800 ml   Filed Weights   2020/09/21 1751  Weight: 119.7 kg    Data Reviewed: I have personally reviewed and interpreted daily labs, tele strips, imagings as discussed above. I reviewed all nursing notes, pharmacy notes, vitals, pertinent old records I have discussed plan of care as described above with RN and patient/family.  CBC: Recent Labs  Lab 09-21-20 1752 09/11/20 0422 09/12/20 0409  WBC 8.5 6.3 7.3  NEUTROABS 7.5 5.3 6.1  HGB 17.0 15.1 14.8  HCT 49.6 43.6 42.7  MCV 86.1 85.0 85.2  PLT 259 223 167   Basic Metabolic Panel: Recent Labs  Lab 09-21-2020 1925  09/21/20 2146 09/11/20 0038 09/11/20 0422 09/12/20 0409  NA 144  --   --  145 144  K 3.3*  --   --  3.9 4.0  CL 108  --   --  113* 110  CO2 20*  --   --  23 23  GLUCOSE 330*  --   --  197* 328*  BUN 29*  --   --  25* 28*  CREATININE 0.88  --   --  0.54* 0.66  CALCIUM 8.1*  --   --  8.4* 8.4*  MG  --  12.5* 2.4 2.4 2.2  PHOS  --  2.7  --  2.3* 3.9    Studies: No results found.  Scheduled Meds: . albuterol  2 puff Inhalation Q6H  . vitamin C  500 mg Oral Daily  . aspirin EC  81 mg Oral Daily  . dexamethasone (DECADRON) injection  6 mg Intravenous Q24H  . enoxaparin (LOVENOX) injection  0.5 mg/kg Subcutaneous Q24H  . gabapentin  900 mg Oral QHS  . insulin aspart  0-20 Units Subcutaneous TID WC  . insulin aspart  10 Units Subcutaneous TID WC  . insulin detemir  26 Units Subcutaneous BID  . linagliptin  5 mg Oral Daily  . pantoprazole  40 mg Oral Daily  . vitamin B-12  1,000 mcg Oral Daily  .  Vitamin D (Ergocalciferol)  50,000 Units Oral Q7 days  . zinc sulfate  220 mg Oral Daily   Continuous Infusions: . remdesivir 100 mg in NS 100 mL Stopped (09/12/20 1207)   PRN Meds: acetaminophen **OR** acetaminophen, chlorpheniramine-HYDROcodone, guaiFENesin-dextromethorphan, ondansetron **OR** ondansetron (ZOFRAN) IV  Time spent: 35 minutes  Author: Gillis Santa. MD [facility] 09/12/2020 3:22 PM  To reach On-call, see care teams to locate the attending and reach out to them via [email or web address]. If 7PM-7AM, please contact night-coverage If you still have difficulty reaching the attending provider, please page the [facility] (Director on Call) for [facility] on amion for assistance.

## 2020-09-12 NOTE — ED Notes (Signed)
Patient had not voided much today and has drank a lot of fluids, his purwick is not working so the sheet were wet.  We changed the sheets and his pulse ox did go down to low 80s.  I have asked him to rest a bit so he can recover.

## 2020-09-12 NOTE — ED Notes (Signed)
Ate all of breakfast.  Alert.

## 2020-09-13 ENCOUNTER — Inpatient Hospital Stay: Payer: No Typology Code available for payment source

## 2020-09-13 ENCOUNTER — Encounter: Payer: Self-pay | Admitting: Student

## 2020-09-13 DIAGNOSIS — U071 COVID-19: Secondary | ICD-10-CM | POA: Diagnosis not present

## 2020-09-13 DIAGNOSIS — J1282 Pneumonia due to coronavirus disease 2019: Secondary | ICD-10-CM | POA: Diagnosis not present

## 2020-09-13 LAB — CBC WITH DIFFERENTIAL/PLATELET
Abs Immature Granulocytes: 0.38 10*3/uL — ABNORMAL HIGH (ref 0.00–0.07)
Basophils Absolute: 0 10*3/uL (ref 0.0–0.1)
Basophils Relative: 0 %
Eosinophils Absolute: 0 10*3/uL (ref 0.0–0.5)
Eosinophils Relative: 0 %
HCT: 48 % (ref 39.0–52.0)
Hemoglobin: 16.3 g/dL (ref 13.0–17.0)
Immature Granulocytes: 2 %
Lymphocytes Relative: 8 %
Lymphs Abs: 1.3 10*3/uL (ref 0.7–4.0)
MCH: 29.5 pg (ref 26.0–34.0)
MCHC: 34 g/dL (ref 30.0–36.0)
MCV: 86.8 fL (ref 80.0–100.0)
Monocytes Absolute: 0.5 10*3/uL (ref 0.1–1.0)
Monocytes Relative: 3 %
Neutro Abs: 14.2 10*3/uL — ABNORMAL HIGH (ref 1.7–7.7)
Neutrophils Relative %: 87 %
Platelets: 130 10*3/uL — ABNORMAL LOW (ref 150–400)
RBC: 5.53 MIL/uL (ref 4.22–5.81)
RDW: 13.2 % (ref 11.5–15.5)
WBC: 16.4 10*3/uL — ABNORMAL HIGH (ref 4.0–10.5)
nRBC: 0.2 % (ref 0.0–0.2)

## 2020-09-13 LAB — HEMOGLOBIN A1C
Hgb A1c MFr Bld: 12.6 % — ABNORMAL HIGH (ref 4.8–5.6)
Mean Plasma Glucose: 314.92 mg/dL

## 2020-09-13 LAB — PROCALCITONIN: Procalcitonin: 0.45 ng/mL

## 2020-09-13 LAB — BLOOD GAS, ARTERIAL
Acid-base deficit: 2.4 mmol/L — ABNORMAL HIGH (ref 0.0–2.0)
Bicarbonate: 21.7 mmol/L (ref 20.0–28.0)
Delivery systems: POSITIVE
Expiratory PAP: 6
FIO2: 1
Inspiratory PAP: 10
O2 Saturation: 95.6 %
Patient temperature: 37
pCO2 arterial: 35 mmHg (ref 32.0–48.0)
pH, Arterial: 7.4 (ref 7.350–7.450)
pO2, Arterial: 79 mmHg — ABNORMAL LOW (ref 83.0–108.0)

## 2020-09-13 LAB — COMPREHENSIVE METABOLIC PANEL
ALT: 33 U/L (ref 0–44)
AST: 79 U/L — ABNORMAL HIGH (ref 15–41)
Albumin: 2.8 g/dL — ABNORMAL LOW (ref 3.5–5.0)
Alkaline Phosphatase: 260 U/L — ABNORMAL HIGH (ref 38–126)
Anion gap: 15 (ref 5–15)
BUN: 55 mg/dL — ABNORMAL HIGH (ref 8–23)
CO2: 18 mmol/L — ABNORMAL LOW (ref 22–32)
Calcium: 8.5 mg/dL — ABNORMAL LOW (ref 8.9–10.3)
Chloride: 107 mmol/L (ref 98–111)
Creatinine, Ser: 2.18 mg/dL — ABNORMAL HIGH (ref 0.61–1.24)
GFR, Estimated: 31 mL/min — ABNORMAL LOW (ref >60)
Glucose, Bld: 365 mg/dL — ABNORMAL HIGH (ref 70–99)
Potassium: 4.7 mmol/L (ref 3.5–5.1)
Sodium: 140 mmol/L (ref 135–145)
Total Bilirubin: 2 mg/dL — ABNORMAL HIGH (ref 0.3–1.2)
Total Protein: 6 g/dL — ABNORMAL LOW (ref 6.5–8.1)

## 2020-09-13 LAB — GLUCOSE, CAPILLARY
Glucose-Capillary: 323 mg/dL — ABNORMAL HIGH (ref 70–99)
Glucose-Capillary: 288 mg/dL — ABNORMAL HIGH (ref 70–99)
Glucose-Capillary: 277 mg/dL — ABNORMAL HIGH (ref 70–99)
Glucose-Capillary: 228 mg/dL — ABNORMAL HIGH (ref 70–99)
Glucose-Capillary: 137 mg/dL — ABNORMAL HIGH (ref 70–99)

## 2020-09-13 LAB — PROTIME-INR
INR: 2 — ABNORMAL HIGH (ref 0.8–1.2)
Prothrombin Time: 22 seconds — ABNORMAL HIGH (ref 11.4–15.2)

## 2020-09-13 LAB — FERRITIN: Ferritin: 1248 ng/mL — ABNORMAL HIGH (ref 24–336)

## 2020-09-13 LAB — PHOSPHORUS: Phosphorus: 6.9 mg/dL — ABNORMAL HIGH (ref 2.5–4.6)

## 2020-09-13 LAB — MAGNESIUM: Magnesium: 2.1 mg/dL (ref 1.7–2.4)

## 2020-09-13 LAB — D-DIMER, QUANTITATIVE: D-Dimer, Quant: >20 ug/mL-FEU — ABNORMAL HIGH (ref 0.00–0.50)

## 2020-09-13 LAB — C-REACTIVE PROTEIN: CRP: 6.5 mg/dL — ABNORMAL HIGH (ref <1.0)

## 2020-09-13 MED ORDER — PANTOPRAZOLE SODIUM 40 MG IV SOLR
40.0000 mg | INTRAVENOUS | Status: DC
  Administered 2020-09-13 – 2020-09-15 (×3): 40 mg via INTRAVENOUS
  Filled 2020-09-13 (×3): qty 40

## 2020-09-13 MED ORDER — MORPHINE SULFATE (PF) 2 MG/ML IV SOLN
2.0000 mg | Freq: Once | INTRAVENOUS | Status: AC
  Administered 2020-09-13: 2 mg via INTRAVENOUS
  Filled 2020-09-13: qty 1

## 2020-09-13 MED ORDER — MORPHINE SULFATE (PF) 2 MG/ML IV SOLN
2.0000 mg | INTRAVENOUS | Status: DC | PRN
  Administered 2020-09-13 – 2020-09-14 (×4): 2 mg via INTRAVENOUS
  Filled 2020-09-13 (×4): qty 1

## 2020-09-13 MED ORDER — STERILE WATER FOR INJECTION IJ SOLN
INTRAMUSCULAR | Status: AC
  Filled 2020-09-13: qty 10

## 2020-09-13 MED ORDER — DILTIAZEM HCL-DEXTROSE 125-5 MG/125ML-% IV SOLN (PREMIX)
5.0000 mg/h | INTRAVENOUS | Status: DC
  Administered 2020-09-14: 5 mg/h via INTRAVENOUS
  Administered 2020-09-14: 10 mg/h via INTRAVENOUS
  Filled 2020-09-13 (×2): qty 125

## 2020-09-13 MED ORDER — LORAZEPAM 2 MG/ML IJ SOLN
INTRAMUSCULAR | Status: AC
  Administered 2020-09-13: 2 mg
  Filled 2020-09-13: qty 1

## 2020-09-13 MED ORDER — VECURONIUM BROMIDE 10 MG IV SOLR
INTRAVENOUS | Status: AC
  Filled 2020-09-13: qty 10

## 2020-09-13 MED ORDER — FENTANYL CITRATE (PF) 100 MCG/2ML IJ SOLN
INTRAMUSCULAR | Status: AC
  Filled 2020-09-13: qty 4

## 2020-09-13 MED ORDER — MORPHINE SULFATE (PF) 2 MG/ML IV SOLN
2.0000 mg | Freq: Once | INTRAVENOUS | Status: AC
  Administered 2020-09-13: 2 mg via INTRAVENOUS

## 2020-09-13 MED ORDER — HEPARIN (PORCINE) 25000 UT/250ML-% IV SOLN
1650.0000 [IU]/h | INTRAVENOUS | Status: DC
  Administered 2020-09-13: 1650 [IU]/h via INTRAVENOUS
  Filled 2020-09-13: qty 250

## 2020-09-13 MED ORDER — CHLORHEXIDINE GLUCONATE CLOTH 2 % EX PADS
6.0000 | MEDICATED_PAD | Freq: Every day | CUTANEOUS | Status: DC
  Administered 2020-09-14: 6 via TOPICAL

## 2020-09-13 MED ORDER — METOPROLOL TARTRATE 5 MG/5ML IV SOLN
5.0000 mg | Freq: Once | INTRAVENOUS | Status: AC
  Administered 2020-09-13: 5 mg via INTRAVENOUS
  Filled 2020-09-13: qty 5

## 2020-09-13 MED ORDER — ORAL CARE MOUTH RINSE
15.0000 mL | Freq: Two times a day (BID) | OROMUCOSAL | Status: DC
  Administered 2020-09-13 – 2020-09-15 (×5): 15 mL via OROMUCOSAL

## 2020-09-13 MED ORDER — CHLORHEXIDINE GLUCONATE 0.12 % MT SOLN
15.0000 mL | Freq: Two times a day (BID) | OROMUCOSAL | Status: DC
  Administered 2020-09-13 – 2020-09-15 (×5): 15 mL via OROMUCOSAL
  Filled 2020-09-13: qty 15

## 2020-09-13 MED ORDER — INSULIN ASPART 100 UNIT/ML ~~LOC~~ SOLN
0.0000 [IU] | SUBCUTANEOUS | Status: DC
  Administered 2020-09-13: 15 [IU] via SUBCUTANEOUS
  Administered 2020-09-13 (×2): 11 [IU] via SUBCUTANEOUS
  Administered 2020-09-13: 7 [IU] via SUBCUTANEOUS
  Administered 2020-09-14: 4 [IU] via SUBCUTANEOUS
  Administered 2020-09-14: 7 [IU] via SUBCUTANEOUS
  Administered 2020-09-14: 3 [IU] via SUBCUTANEOUS
  Administered 2020-09-14: 4 [IU] via SUBCUTANEOUS
  Administered 2020-09-14: 3 [IU] via SUBCUTANEOUS
  Administered 2020-09-14: 4 [IU] via SUBCUTANEOUS
  Administered 2020-09-15: 7 [IU] via SUBCUTANEOUS
  Administered 2020-09-15: 4 [IU] via SUBCUTANEOUS
  Administered 2020-09-15: 7 [IU] via SUBCUTANEOUS
  Administered 2020-09-15: 3 [IU] via SUBCUTANEOUS
  Administered 2020-09-15: 7 [IU] via SUBCUTANEOUS
  Filled 2020-09-13 (×14): qty 1

## 2020-09-13 MED ORDER — ENOXAPARIN SODIUM 120 MG/0.8ML ~~LOC~~ SOLN
1.0000 mg/kg | Freq: Two times a day (BID) | SUBCUTANEOUS | Status: DC
  Administered 2020-09-13: 120 mg via SUBCUTANEOUS
  Filled 2020-09-13 (×2): qty 0.8

## 2020-09-13 MED ORDER — LORAZEPAM 2 MG/ML IJ SOLN: 1.0000 mg | Freq: Once | INTRAMUSCULAR | Status: AC

## 2020-09-13 MED ORDER — MORPHINE SULFATE (PF) 2 MG/ML IV SOLN
INTRAVENOUS | Status: AC
  Filled 2020-09-13: qty 1

## 2020-09-13 MED ORDER — MIDAZOLAM HCL 2 MG/2ML IJ SOLN
INTRAMUSCULAR | Status: AC
  Filled 2020-09-13: qty 4

## 2020-09-13 MED ORDER — MELATONIN 3 MG PO TABS
3.0000 mg | ORAL_TABLET | Freq: Every day | ORAL | Status: DC
  Filled 2020-09-13 (×3): qty 1

## 2020-09-13 NOTE — Progress Notes (Signed)
   09/13/20 0213  Assess: MEWS Score  BP (!) 167/91  Pulse Rate (!) 131  ECG Heart Rate (!) 119  Resp (!) 22  SpO2 94 %  Assess: MEWS Score  MEWS Temp 0  MEWS Systolic 0  MEWS Pulse 2  MEWS RR 1  MEWS LOC 0  MEWS Score 3  MEWS Score Color Yellow  Assess: if the MEWS score is Yellow or Red  Were vital signs taken at a resting state? Yes  Focused Assessment No change from prior assessment  Early Detection of Sepsis Score *See Row Information* Low  MEWS guidelines implemented *See Row Information* Yes  Treat  Pain Score 5  Take Vital Signs  Increase Vital Sign Frequency  Yellow: Q 2hr X 2 then Q 4hr X 2, if remains yellow, continue Q 4hrs  Escalate  MEWS: Escalate Red: discuss with charge nurse/RN and provider, consider discussing with RRT  Notify: Charge Nurse/RN  Name of Charge Nurse/RN Notified Heywood Footman RN  Date Charge Nurse/RN Notified 09/13/20  Time Charge Nurse/RN Notified 0215  Notify: Provider  Provider Name/Title Webb Silversmith NP  Date Provider Notified 09/13/20  Time Provider Notified [phone]  Notification Type Page  Notification Reason Change in status  Response See new orders  Date of Provider Response 09/13/20  Time of Provider Response 0225  Document  Patient Outcome Transferred/level of care increased  Progress note created (see row info) Yes

## 2020-09-13 NOTE — Consult Note (Signed)
NAME:  Steven Gallegos, MRN:  [id number], DOB:  1946-07-13, LOS: 2 ADMISSION DATE:  09/18/2020, CONSULTATION DATE:  09/13/2020 REFERRING MD:  Webb Silversmith, NP, CHIEF COMPLAINT: Shortness of breath  Brief History:  41 yod male with COVID-19 Pneumonia admitted to PCU initially on HHFNC, ultimately requiring BiPAP due to acute hypoxic respiratory failure and increased work of breathing with admission to ICU.  History of Present Illness:  74 year old male presenting to the ED from home with complaints of " flu-like" symptoms over the past week.  He reported progressive shortness of breath, fever, chills, fatigue, decreased appetite, sore throat and a nonproductive cough.  Patient received [employer] vaccine in September 2021.  EMS reported the patient was hypoxic at 72% on room air and placed on nonrebreather in route to ED.  ED course: Initial vitals were, T-98.1, HR-99, RR-30, BP 132/84, SPO2-92% on normal high flow nasal cannula.  Patient also reports being noncompliant with diabetic medication since he has had poor p.o. intake.  While in the ED patient had multiple desaturation episodes, requiring high flow nasal cannula with nonrebreather and ultimately BiPAP.  Patient was given Lasix and eventually able to be weaned to [facility] and transferred to PCU. PCU course: overnight on PCU patient developed respiratory distress: Tachypneic with RR in the 40s.  Patient was placed on CPAP with continued respiratory distress, heart rhythm changing to A. fib RVR.  Patient was placed on BiPAP, given metoprolol, morphine & lorazepam.  Due to work of breathing and concern for possible need of intubation and mechanical ventilation patient was transferred to the ICU with PCCM consulted. Upon arrival to ICU patient drowsy but responsive, able to follow commands on BiPAP.  Some abdominal muscle use noted, fine crackles auscultated in bilateral lung bases.  A.m. ordered dose of furosemide given early, Foley catheter  placed & morphine given.  ABG WNL.  We will continue to monitor patient, low threshold for intubation. Past Medical History:  Hypertension Type 2 diabetes mellitus Spinal stenosis Osteoarthritis Atrial fibrillation -per patient self-report  Significant Hospital Events:  09/14/2020-admit to [facility] service on HFNC, Covid positive 09/12/2020-transferred to PCU on HHFNC 09/13/2020-transferred to ICU, as stepdown patient on BiPAP  Consults:    Procedures:    Significant Diagnostic Tests:    Micro Data:  09/27/2020 BC x2 >> + staph species (possible contaminant, not fully resulted) Sep 15, 2020 urine culture >> multiple species present, recollection suggested September 15, 2020 COVID -19 >> positive  Antimicrobials:  09/28/2020 azithromycin x1 15-Sep-2020 ceftriaxone x1  Interim History / Subjective:  Patient is lethargic, but responsive & able to follow commands.  Describes his breathing as feeling better on the BiPAP.  Fine crackles auscultated in bilateral lung bases.  Patient remains in atrial fibrillation which he says is chronic, but now rate controlled. Foley catheter placed, a.m. Lasix dose given early, morphine 2 mg given for work of breathing and anxiety.  ABG reassuring -will monitor on BiPAP.  Low threshold for intubation.  Labs/ Imaging personally reviewed Net: -290 mL ( + 1.8 L since admit) A.m. labs pending ABG: 7.40/ 35/ 79/ 21.7 CXR 09/07/2020: moderate severity bilateral multifocal infiltrates, most prominent within the bilateral lung bases. Repeat chest x-ray pending. Objective   Blood pressure (!) 167/91, pulse (!) 131, temperature 97.8 F (36.6 C), temperature source Oral, resp. rate (!) 22, height 6\' 4"  (1.93 m), weight 119.7 kg, SpO2 95 %.    FiO2 (%):  [100 %] 100 %   Intake/Output Summary (Last 24 hours) at  09/13/2020 0420 Last data filed at 09/12/2020 2235 Gross per 24 hour  Intake 360 ml  Output 550 ml  Net -190 ml   Filed Weights   05-12-2021 1751  Weight: 119.7 kg     Examination: General: Adult male, critically ill, lying in bed on BiPAP HEENT: MM pink/dry, anicteric, atraumatic, neck supple Neuro: *A&O x 3, able to follow commands, PERRL +3, MAE CV: s1s2 irregular, atrial fibrillation on monitor, no r/m/g Pulm: Regular, slightly labored on BiPAP, breath sounds diminished-BUL & diminished with crackles-BLL GI: soft, rounded, non tender, bs x 4 GU: foley placed with clear yellow urine Skin: Limited exam- no rashes/lesions noted Extremities: warm/dry, pulses + 2 R/P, no edema noted  Resolved Hospital Problem list     Assessment & Plan:  Acute hypoxic respiratory failure COVID pneumonia EMS reported finding the patient at 70% SPO2 on room air at home.  Upon arrival to the ED on 09/09/2020 patient has had multiple desaturation episodes requiring BiPAP intermittently.  [facility]. Overnight 09/12/2020 to 09/13/2020 patient' work of breathing increased/developed A. fib RVR/and became hypoxic requiring BiPAP and transferred to the ICU due to concerns for need of intubation and mechanical ventilation.  Low threshold for intubation. LDH 455, Ferritin 846, CRP 10.1, Lactic 2.9 > 6.4 > 4.2 > 1.9, Procalcitonin < 0.10 (1/6) -Supplemental oxygen as needed to maintain SPO2 greater than 88%, wean BiPAP to HHFNC as tolerated -Continue Decadron 6 mg IV daily -Trend PCT, monitor WBC/fever curve -Intermittent chest x-ray & ABG PRN -Continue remdesivir x 5 days -Continue vitamin C and zinc daily -Bronchodilators as needed -Consider prone positioning as tolerated -Diurese as tolerated, cautious IV hydration  Atrial fibrillation with rapid ventricular response PMHx: Atrial fibrillation -patient reports being seen at the [facility], this was not in epic records but patient self-reported having been in atrial fibrillation chronically.  He reported being previously on anticoagulation which was stopped for unclear reasons.   Metoprolol push  -Continuous cardiac monitoring -Lovenox dose increased per pharmacy consult to provide systemic anticoagulation -Diltiazem drip ordered as needed to control heart rate < 110 -Avoiding amiodarone since patient reports chronic atrial fibrillation, limited previous medical history documentation available due to treatment from [facility] & patient reporting no systemic anticoagulation ordered outpatient.  Type 2 diabetes mellitus On admission patient admitted to not taking diabetic medication regularly over the previous week & has been started on Decadron daily. Patient missed multiple doses on 09/12/2020, CBG on arrival to ICU > 300. -Diabetes coordinator consulted, appreciate input - patient NPO, CBG monitoring changed to every 4 with SSI resistant scale -Continue Levemir 26 units twice daily -Continue linagliptin 5 mg p.o. as able  Best practice (evaluated daily)  Diet: N.p.o. Pain/Anxiety/Delirium protocol (if indicated): Morphine PRN for anxiety/WOB VAP protocol (if indicated): N/A DVT prophylaxis: Lovenox GI prophylaxis: Protonix Glucose control: Every 4 CBG monitoring with SSI/Levemir/linagliptin Mobility: Bedrest, mobilize as tolerated Disposition: Stepdown  Goals of Care:  Last date of multidisciplinary goals of care discussion: 09/13/2020 Family and staff present: APP, care RN & patient Summary of discussion: Discussed plan of care including BiPAP support with possible need for mechanical intubation and ventilation.  All questions answered Follow up goals of care discussion due: 09/14/2020 Code Status: Full  Labs   CBC: Recent Labs  Lab 05-12-2021 1752 09/11/20 0422 09/12/20 0409  WBC 8.5 6.3 7.3  NEUTROABS 7.5 5.3 6.1  HGB 17.0 15.1 14.8  HCT 49.6 43.6 42.7  MCV  86.1 85.0 85.2  PLT 259 223 167    Basic Metabolic Panel: Recent Labs  Lab 09/05/2020 1925 Sep 15, 2020 2146 09/11/20 0038 09/11/20 0422 09/12/20 0409  NA 144  --   --  145 144  K 3.3*  --   --  3.9  4.0  CL 108  --   --  113* 110  CO2 20*  --   --  23 23  GLUCOSE 330*  --   --  197* 328*  BUN 29*  --   --  25* 28*  CREATININE 0.88  --   --  0.54* 0.66  CALCIUM 8.1*  --   --  8.4* 8.4*  MG  --  12.5* 2.4 2.4 2.2  PHOS  --  2.7  --  2.3* 3.9   GFR: Estimated Creatinine Clearance: 116.3 mL/min (by C-G formula based on SCr of 0.66 mg/dL). Recent Labs  Lab 09/23/2020 1751 September 15, 2020 1752 Sep 15, 2020 2038 September 15, 2020 2146 10/04/2020 2218 09/11/20 0422 09/12/20 0409  PROCALCITON  --   --   --  <0.10  --   --   --   WBC  --  8.5  --   --   --  6.3 7.3  LATICACIDVEN 2.9*  --  6.4*  --  4.2* 1.9  --     Liver Function Tests: Recent Labs  Lab 09/14/2020 1925 09/11/20 0422 09/12/20 0409  AST 46* 47* 41  ALT 31 33 30  ALKPHOS 82 90 119  BILITOT 1.1 1.0 0.8  PROT 6.0* 6.3* 5.9*  ALBUMIN 2.3* 2.5* 2.4*   No results for input(s): LIPASE, AMYLASE in the last 168 hours. No results for input(s): AMMONIA in the last 168 hours.  ABG    Component Value Date/Time   HCO3 20.4 Sep 15, 2020 1752   ACIDBASEDEF 4.6 (H) 09/16/2020 1752   O2SAT 36.3 09/21/2020 1752     Coagulation Profile: Recent Labs  Lab 15-Sep-2020 1752  INR 1.2    Cardiac Enzymes: No results for input(s): CKTOTAL, CKMB, CKMBINDEX, TROPONINI in the last 168 hours.  HbA1C: Hemoglobin A1C  Date/Time Value Ref Range Status  01/03/2015 07:26 PM 10.8 (H) % Final    Comment:    4.0-6.0 NOTE: New Reference Range  11/11/14     CBG: Recent Labs  Lab 09/12/20 0429 09/12/20 0810 09/12/20 1158 09/12/20 1632 09/12/20 1742  GLUCAP 268* 276* 302* 292* 293*    Review of Systems: positives in BOLD  Gen: Denies fever, chills, weight change, fatigue, night sweats HEENT: Denies blurred vision, double vision, hearing loss, tinnitus, sinus congestion, rhinorrhea, sore throat, neck stiffness, dysphagia PULM: Denies shortness of breath, cough, sputum production, hemoptysis, wheezing CV: Denies chest pain, edema, orthopnea,  paroxysmal nocturnal dyspnea, palpitations GI: Denies abdominal pain, nausea, vomiting, diarrhea, hematochezia, melena, constipation, change in bowel habits GU: Denies dysuria, hematuria, polyuria, oliguria, urethral discharge Endocrine: Denies hot or cold intolerance, polyuria, polyphagia or appetite change Derm: Denies rash, dry skin, scaling or peeling skin change Heme: Denies easy bruising, bleeding, bleeding gums Neuro: Denies headache, numbness, weakness, slurred speech, loss of memory or consciousness  Past Medical History:  He,  has a past medical history of Class 1 obesity, Hypertension, Osteoarthritis, Peripheral neuropathy, Spinal stenosis, and Type 2 diabetes mellitus (HCC) (15-Sep-2020).   Surgical History:   Past Surgical History:  Procedure Laterality Date  . BACK SURGERY       Social History:   reports that he has quit smoking. He has never used smokeless tobacco. He  reports that he does not drink alcohol and does not use drugs.   Family History:  His family history includes Diabetes Mellitus II in his brother.   Allergies Allergies  Allergen Reactions  . Flexeril [Cyclobenzaprine] Other (See Comments)    Reaction:  Pt gets jittery     Home Medications  Prior to Admission medications   Medication Sig Start Date End Date Taking? Authorizing Provider  Ergocalciferol (VITAMIN D2) 2000 UNITS TABS Take 1 tablet by mouth daily.   Yes [provider]  gabapentin (NEURONTIN) 300 MG capsule Take 900 mg by mouth at bedtime.   Yes [provider]  insulin detemir (LEVEMIR) 100 UNIT/ML injection Inject 25 Units into the skin at bedtime. 01/05/15  Yes [provider]  albuterol (PROVENTIL HFA;VENTOLIN HFA) 108 (90 Base) MCG/ACT inhaler Inhale 2 puffs into the lungs every 6 (six) hours as needed for wheezing or shortness of breath. 05/29/16   Gayla Doss, MD  aspirin EC 81 MG EC tablet Take 1 tablet (81 mg total) by mouth daily. 01/05/15   Auburn Bilberry, MD  cyanocobalamin 500 MCG tablet Take 1,000 mcg by mouth daily.    [provider]  insulin aspart protamine- aspart (NOVOLOG MIX 70/30) (70-30) 100 UNIT/ML injection Inject 45-60 Units into the skin 2 (two) times daily with a meal. 60 units Am, 45 units PM    [provider]  Multiple Vitamins-Minerals (ONE-A-DAY MENS HEALTH FORMULA PO) Take 1 tablet by mouth daily.    [provider]  omeprazole (PRILOSEC) 20 MG capsule Take 20 mg by mouth daily.    [provider]     Critical care time: 45 minutes       Betsey Holiday, AGACNP-BC Acute Care Nurse Practitioner [facility]   [phone] / [phone] Please see Amion for pager details.

## 2020-09-13 NOTE — Progress Notes (Signed)
Pt initially in cpap mode on pres of 4 cm. Exhibiting respiratory distress & tachypnea with RR in 40's. Switched to Bipap mode on setting of 12/6. Pt appears less dyspneic & RR down to 26-30. O2 sat is 95.

## 2020-09-13 NOTE — Progress Notes (Signed)
[facility] Progress Note  Patient: Steven Gallegos    NWG:[id number]  DOA: 09/21/2020     Date of Service: the patient was seen and examined on 09/13/2020  Chief Complaint  Patient presents with  . Respiratory Distress   Brief hospital course: Hale Chalfin is a 75 y.o. male with medical history significant of class I obesity, hypertension, osteoarthritis, diabetic peripheral neuropathy, type 2 diabetes, spinal stenosis who is coming to the emergency department with complaints of progressively worse dyspnea, fever, chills, night sweats, fatigue, malaise, myalgias/arthralgias, decreased appetite, sore throat, nonproductive cough for about a week. He has no known exposures to COVID-19 and had the [employer] vaccine back in September. Denies chest pain, palpitations, dizziness, diaphoresis, PND orthopnea. No abdominal pain, nausea, vomiting, diarrhea, constipation, melena or hematochezia. Denies dysuria, frequency or materia. Denies polyuria, polydipsia, polyphagia or blurred vision.  ED Course: Initial vital signs were temperature 98.1 F, pulse 99, respirations 30, BP 132/84 mmHg O2 sat 92% and normal high flow nasal cannula. The patient received a 1000 mL LR bolus in the ED. I added another 1000 mL of LR bolus, 20 units of insulin SQ and begun COVID-19 treatment protocol.  Labwork: Urinalysis showed glucosuria more than 500, ketonuria 20 and proteinuria 100 mg/dL. CBC was normal with a hemoglobin of 17.0 g/dL. PT/INR/PTT within expected values. Venous blood gas showed a pH of 7.35, PCO2 of 37 mmHg acid base deficit 4.6 mmol/L. Lactic acid was 2.9 then 6.4 than 4.2 mmol/L. Magnesium level was not added on and drawn while the patient was receiving the infusion and resulted in 12.5 mg/dL. Phosphorus and corrected to albumin and calcium were normal. Potassium is 3.3, and CO2 20 mmol/L. Anion gap was 16. Glucose 330, BUN 29 and creatinine 0.88 mg/dL. Total protein 6.0, albumin 2.3  g/dL.   Assessment and Plan: Principal Problem:   Pneumonia due to COVID-19 virus Patient is unvaccinated Continue supplemental oxygen. Incentive spirometry and flutter valve. Bronchodilators as needed. Analgesics as needed. Antiemetics as needed. Antitussives as needed. 1/6---1/10  Remdesivir for 5 days as per pharmacy. Dexamethasone 6 mg IVP daily. 1/6 s/p Actemra per pharmacy x 1 dose given Vitamin D 50,000 units x 1 dose. Monitor CBC, CMP and inflammatory markers. Blood culture 1/4 growing GPC possible contamination Antibiotic discontinued, we will continue to follow final report of blood culture  1/8 started Lasix 40 mg IV twice daily to diurese, monitor renal functions daily and electrolytes 1/9 currently patient is using continuous BiPAP as he was unable to tolerate nonrebreather due to excessive work of breathing   Active Problems: A. Fib with RVR, new developed on 09/12/2020 overnight Continue Cardizem IV infusion If persistent A. fib, will consider cardiology consult CHADS2 =3     Type 2 diabetes mellitus (HCC) Carbohydrate modified diet. Tradjenta per Covid protocol. Levemir per Covid protocol. CBG monitoring with RI SS before meals.    Hypertension Monitor blood pressure. As needed antihypertensive.    Class 1 obesity Lifestyle modification Follow-up with PCP.    Protein-calorie malnutrition, severe (HCC) Poor prognostic factor for Covid. Protein supplementation 3 times daily.    Peripheral neuropathy Continue Neurontin 900 mg p.o. daily.   Body mass index is 32.14 kg/m.        Diet: Heart healthy/carb modified DVT Prophylaxis: Subcutaneous Lovenox   Advance goals of care discussion: Full code  Family Communication: family was not present at bedside, at the time of interview.  The pt provided permission to discuss medical plan  with the family. Opportunity was given to ask question and all questions were answered satisfactorily.    Disposition:  Pt is from Home, admitted with acute hyper respiratory failure secondary to COVID-19 pneumonia, still has severe respiratory failure, which precludes a safe discharge. Discharge to home versus SNF depends on recovery, when recovers from COVID-19 pneumonia. 1/9 currently patient is using continuous BiPAP as he was unable to tolerate nonrebreather due to excessive work of breathing   Subjective: Overnight patient developed A. fib with RVR, Cardizem was started and patient was given a dose of Lopressor.  Ventricular rate is under control now, patient remained on BiPAP since yesterday evening, requires NIV due to severe respiratory failure.  Patient was wearing BiPAP on my exam and he was more comfortable.  Denied any chest pain or palpitations.   Physical Exam: General:  alert oriented to time, place, and person.  Appear in moderate distress, affect appropriate Eyes: PERRLA ENT: Oral Mucosa Clear, moist  Neck: no JVD,  Cardiovascular: S1 and S2 Present, no Murmur,  Respiratory: increased respiratory effort, Bilateral Air entry equal and Decreased, significant bilateral crackles, no wheezing appreciated.  Abdomen: Bowel Sound present, Soft and no tenderness,  Skin: no Rashes Extremities: no Pedal edema, no calf tenderness Neurologic: without any new focal findings Gait not checked due to patient safety concerns  Vitals:   09/13/20 0600 09/13/20 0700 09/13/20 0800 09/13/20 0900  BP: (!) 148/113 (!) 148/82 (!) 140/110 (!) 145/108  Pulse: (!) 49 (!) 111 (!) 44 75  Resp: (!) 24 (!) 30 (!) 37 (!) 33  Temp:   98 F (36.7 C)   TempSrc:   Axillary   SpO2: 97% 95% (!) 87% 98%  Weight:      Height:        Intake/Output Summary (Last 24 hours) at 09/13/2020 1429 Last data filed at 09/13/2020 1300 Gross per 24 hour  Intake 710 ml  Output 845 ml  Net -135 ml   Filed Weights   10/03/2020 1751  Weight: 119.7 kg    Data Reviewed: I have personally reviewed and interpreted  daily labs, tele strips, imagings as discussed above. I reviewed all nursing notes, pharmacy notes, vitals, pertinent old records I have discussed plan of care as described above with RN and patient/family.  CBC: Recent Labs  Lab 09/12/2020 1752 09/11/20 0422 09/12/20 0409 09/13/20 0723  WBC 8.5 6.3 7.3 16.4*  NEUTROABS 7.5 5.3 6.1 14.2*  HGB 17.0 15.1 14.8 16.3  HCT 49.6 43.6 42.7 48.0  MCV 86.1 85.0 85.2 86.8  PLT 259 223 167 130*   Basic Metabolic Panel: Recent Labs  Lab 09/12/2020 1925 09/22/2020 2146 09/11/20 0038 09/11/20 0422 09/12/20 0409 09/13/20 0723  NA 144  --   --  145 144 140  K 3.3*  --   --  3.9 4.0 4.7  CL 108  --   --  113* 110 107  CO2 20*  --   --  23 23 18*  GLUCOSE 330*  --   --  197* 328* 365*  BUN 29*  --   --  25* 28* 55*  CREATININE 0.88  --   --  0.54* 0.66 2.18*  CALCIUM 8.1*  --   --  8.4* 8.4* 8.5*  MG  --  12.5* 2.4 2.4 2.2 2.1  PHOS  --  2.7  --  2.3* 3.9 6.9*    Studies: DG Chest 1 View  Result Date: 09/13/2020 CLINICAL DATA:  Shortness of breath,  history of diabetes and hypertension EXAM: CHEST  1 VIEW COMPARISON:  Radiograph 2020-09-28 FINDINGS: Increasing heterogeneous opacities throughout both lungs with slightly diminished volumes from comparison exam. No discernible pneumothorax or effusion. Stable cardiomediastinal contours accounting for differences in technique. No acute osseous or soft tissue abnormality. Telemetry leads overlie the chest. IMPRESSION: Increasing heterogeneous opacities throughout both lungs, could reflect worsening infection or edema though may be somewhat accentuated by diminished lung volumes. Electronically Signed   By: Kreg Shropshire M.D.   On: 09/13/2020 05:52    Scheduled Meds: . fentaNYL      . midazolam      . sterile water (preservative free)      . sterile water (preservative free)      . vecuronium      . vecuronium      . albuterol  2 puff Inhalation Q6H  . vitamin C  500 mg Oral Daily  . aspirin EC   81 mg Oral Daily  . chlorhexidine  15 mL Mouth Rinse BID  . Chlorhexidine Gluconate Cloth  6 each Topical Daily  . dexamethasone (DECADRON) injection  6 mg Intravenous Q24H  . gabapentin  900 mg Oral QHS  . insulin aspart  0-20 Units Subcutaneous Q4H  . insulin detemir  26 Units Subcutaneous BID  . linagliptin  5 mg Oral Daily  . mouth rinse  15 mL Mouth Rinse q12n4p  . morphine      . pantoprazole (PROTONIX) IV  40 mg Intravenous Q24H  . vitamin B-12  1,000 mcg Oral Daily  . Vitamin D (Ergocalciferol)  50,000 Units Oral Q7 days  . zinc sulfate  220 mg Oral Daily   Continuous Infusions: . diltiazem (CARDIZEM) infusion Stopped (09/13/20 1221)  . heparin    . remdesivir 100 mg in NS 100 mL 100 mg (09/13/20 0914)   PRN Meds: acetaminophen **OR** acetaminophen, chlorpheniramine-HYDROcodone, guaiFENesin-dextromethorphan, morphine injection, ondansetron **OR** ondansetron (ZOFRAN) IV  Time spent: 35 minutes  Author: Gillis Santa. MD [facility] 09/13/2020 2:29 PM  To reach On-call, see care teams to locate the attending and reach out to them via [email or web address]. If 7PM-7AM, please contact night-coverage If you still have difficulty reaching the attending provider, please page the [facility] (Director on Call) for [facility] on amion for assistance.

## 2020-09-13 NOTE — Progress Notes (Signed)
Patient received from Progressive care, in resp distress placed on Bipap 10/8 at 100%.At this time Patient continues to maintain oxygen saturation WDL, see [facility] for further assessment.

## 2020-09-13 NOTE — Progress Notes (Addendum)
    BRIEF OVERNIGHT PROGRESS REPORT  SUBJECTIVE: Notified by RN that patient was in acute respiratory distress with increased work of breathing noted. He also went into AFIB with RVR rate >140 beats/min.  OBJECTIVE: On arrival to the bedside, he was afebrile with blood pressure 167/91 mm Hg and pulse rate 131 beats/min. There were no focal neurological deficits; he was alert and oriented x4, but exhibited signs of increased work of breathing with accessory muscle use, paradoxical movement of the abdomen and supraclavicular and intercostal retraction. Maintaining oxygen saturation above 90% after being switched to BiPap  ASSESSMENT& PLAN:  -Acute Respiratory Failure secondary to COVID Pneumonia -Transfer to ICU -Supplemental O2 as needed to maintain O2 saturations 88 to 92% -BiPAP, wean as tolerated -High risk for intubation -Follow intermittent ABG and chest x-ray as needed -Continue remdesivir x5 days  -Continue steroids. -Encourage OOB, IS, FV, and awake proning if able -Monitor CMP and inflammatory markers -Blood cultures 1/4 growing GPC, given worsening symptoms, would repeat chest xray as above, CBC, Procal and consider broad coverage if appropriate -IV Lasix as blood pressure and renal function permits: currently on Lasix 40 mg IV BID -As needed bronchodilators -PCCM Consult     # New onset Afib with RVR - No hx of Afib this appears to have been a brief episode with HR>140 sustaining now converted to NSR with IV Metoprolol 5mg  x 1 - EKG+Telemetry - Troponins - TSH, FT4 - CXR as above - TTEcho # Thromboembolism Risk Management CHADS2 =3 -Continue anticoagulation with Lovenox -Consider cardiology consult if persistent     , BSN, MSN, DNP, CCRN,FNP-C  [facility] Nurse Practitioner  [facility]

## 2020-09-13 NOTE — Progress Notes (Signed)
Pt transported from 2A to ICU on Bipap without incident.

## 2020-09-13 NOTE — Progress Notes (Signed)
ANTICOAGULATION CONSULT NOTE - Initial Consult  Pharmacy Consult for Lovenox Tx Indication: atrial fibrillation  Allergies  Allergen Reactions  . Flexeril [Cyclobenzaprine] Other (See Comments)    Reaction:  Pt gets jittery    Patient Measurements: Height: 6\' 4"  (193 cm) Weight: 119.7 kg (264 lb) IBW/kg (Calculated) : 86.8  Vital Signs: BP: 167/91 (01/09 0213) Pulse Rate: 131 (01/09 0213)  Labs: Recent Labs    09-14-2020 1752 2020-09-14 1925 09/14/2020 2146 09/11/20 0422 09/12/20 0409  HGB 17.0  --   --  15.1 14.8  HCT 49.6  --   --  43.6 42.7  PLT 259  --   --  223 167  APTT 30  --   --   --   --   LABPROT 14.6  --   --   --   --   INR 1.2  --   --   --   --   CREATININE  --  0.88  --  0.54* 0.66  TROPONINIHS  --  21* 21*  --   --     Estimated Creatinine Clearance: 116.3 mL/min (by C-G formula based on SCr of 0.66 mg/dL).   Medical History: Past Medical History:  Diagnosis Date  . Class 1 obesity   . Hypertension   . Osteoarthritis   . Peripheral neuropathy   . Spinal stenosis   . Type 2 diabetes mellitus (HCC) 09/14/20    Medications:  Pt previously on Lovenox prophylaxis 0.5 mg/kg q24hr.    Plan:  Ordered STAT INR Transitioned pt to Lovenox 1mg /kg Q12hr starting at 0600 this AM. Will monitor CBC every 3 days.  11/08/2020, PharmD, [facility] 09/13/2020 4:43 AM

## 2020-09-13 NOTE — Progress Notes (Signed)
ANTICOAGULATION CONSULT NOTE - Initial Consult  Pharmacy Consult for Heparin Infusion  Indication: atrial fibrillation  Allergies  Allergen Reactions  . Flexeril [Cyclobenzaprine] Other (See Comments)    Reaction:  Pt gets jittery    Patient Measurements: Height: 6\' 4"  (193 cm) Weight: 119.7 kg (264 lb) IBW/kg (Calculated) : 86.8 Heparin Dosing Weight: 110 kg  Vital Signs: Temp: 98 F (36.7 C) (01/09 0800) Temp Source: Axillary (01/09 0800) BP: 145/108 (01/09 0900) Pulse Rate: 75 (01/09 0900)  Labs: Recent Labs    09/22/2020 1752 09/19/2020 1752 09/16/2020 1925 09/27/2020 2146 09/11/20 0422 09/12/20 0409 09/13/20 0723  HGB 17.0  --   --   --  15.1 14.8 16.3  HCT 49.6  --   --   --  43.6 42.7 48.0  PLT 259  --   --   --  223 167 130*  APTT 30  --   --   --   --   --   --   LABPROT 14.6  --   --   --   --   --  22.0*  INR 1.2  --   --   --   --   --  2.0*  CREATININE  --    < > 0.88  --  0.54* 0.66 2.18*  TROPONINIHS  --   --  21* 21*  --   --   --    < > = values in this interval not displayed.    Estimated Creatinine Clearance: 42.7 mL/min (A) (by C-G formula based on SCr of 2.18 mg/dL (H)).   Medical History: Past Medical History:  Diagnosis Date  . Atrial fibrillation (HCC)   . Class 1 obesity   . Hypertension   . Osteoarthritis   . Peripheral neuropathy   . Spinal stenosis   . Type 2 diabetes mellitus (HCC) 09/24/2020     Assessment: 75 yo male with PMH of HTN, DM, Peripheral neuropathy and OA. Patient admitted pneumonia due to COVID-19 virus. Patient was started on enoxaparin treatment dosing on 1/9 @ 0530 for new A. Fib w/RVR. [name] Scr has increased from 0.66 to 2.18. Due to acute renal failure decision was made to transition patient from therapeutic enoxaparin to heparin infusion.    Goal of Therapy:   Heparin level 0.3-0.7 units/ml Monitor platelets by anticoagulation protocol: Yes   Plan:  Will start  heparin infusion at 1650 units/hr @ 1700  this evening (~12 after enoxaparin dose)  Check anti-Xa level in 8 hours and daily while on heparin Continue to monitor H&H and platelets  3/9, PharmD, BCPS Clinical Pharmacist 09/13/2020 12:39 PM

## 2020-09-14 ENCOUNTER — Inpatient Hospital Stay: Payer: No Typology Code available for payment source

## 2020-09-14 DIAGNOSIS — E669 Obesity, unspecified: Secondary | ICD-10-CM

## 2020-09-14 DIAGNOSIS — U071 COVID-19: Secondary | ICD-10-CM | POA: Diagnosis not present

## 2020-09-14 DIAGNOSIS — E43 Unspecified severe protein-calorie malnutrition: Secondary | ICD-10-CM | POA: Diagnosis not present

## 2020-09-14 DIAGNOSIS — J9601 Acute respiratory failure with hypoxia: Secondary | ICD-10-CM

## 2020-09-14 DIAGNOSIS — J1282 Pneumonia due to coronavirus disease 2019: Secondary | ICD-10-CM | POA: Diagnosis not present

## 2020-09-14 LAB — CBC WITH DIFFERENTIAL/PLATELET
Abs Immature Granulocytes: 0.16 10*3/uL — ABNORMAL HIGH (ref 0.00–0.07)
Basophils Absolute: 0 10*3/uL (ref 0.0–0.1)
Basophils Relative: 0 %
Eosinophils Absolute: 0.1 10*3/uL (ref 0.0–0.5)
Eosinophils Relative: 0 %
HCT: 48.7 % (ref 39.0–52.0)
Hemoglobin: 16.7 g/dL (ref 13.0–17.0)
Immature Granulocytes: 1 %
Lymphocytes Relative: 8 %
Lymphs Abs: 1 10*3/uL (ref 0.7–4.0)
MCH: 29.4 pg (ref 26.0–34.0)
MCHC: 34.3 g/dL (ref 30.0–36.0)
MCV: 85.7 fL (ref 80.0–100.0)
Monocytes Absolute: 0.3 10*3/uL (ref 0.1–1.0)
Monocytes Relative: 2 %
Neutro Abs: 11.6 10*3/uL — ABNORMAL HIGH (ref 1.7–7.7)
Neutrophils Relative %: 89 %
Platelets: 115 10*3/uL — ABNORMAL LOW (ref 150–400)
RBC: 5.68 MIL/uL (ref 4.22–5.81)
RDW: 13.2 % (ref 11.5–15.5)
WBC: 13.1 10*3/uL — ABNORMAL HIGH (ref 4.0–10.5)
nRBC: 0.2 % (ref 0.0–0.2)

## 2020-09-14 LAB — BLOOD GAS, ARTERIAL
Acid-base deficit: 9.6 mmol/L — ABNORMAL HIGH (ref 0.0–2.0)
Bicarbonate: 21.7 mmol/L (ref 20.0–28.0)
FIO2: 1
MECHVT: 500 mL
O2 Saturation: 99.9 %
PEEP: 15 cmH2O
Patient temperature: 37
RATE: 20 resp/min
pCO2 arterial: 70 mmHg (ref 32.0–48.0)
pH, Arterial: 7.1 — CL (ref 7.350–7.450)
pO2, Arterial: 396 mmHg — ABNORMAL HIGH (ref 83.0–108.0)

## 2020-09-14 LAB — COMPREHENSIVE METABOLIC PANEL
ALT: 29 U/L (ref 0–44)
AST: 71 U/L — ABNORMAL HIGH (ref 15–41)
Albumin: 2.7 g/dL — ABNORMAL LOW (ref 3.5–5.0)
Alkaline Phosphatase: 301 U/L — ABNORMAL HIGH (ref 38–126)
Anion gap: 16 — ABNORMAL HIGH (ref 5–15)
BUN: 75 mg/dL — ABNORMAL HIGH (ref 8–23)
CO2: 22 mmol/L (ref 22–32)
Calcium: 8.3 mg/dL — ABNORMAL LOW (ref 8.9–10.3)
Chloride: 107 mmol/L (ref 98–111)
Creatinine, Ser: 3.33 mg/dL — ABNORMAL HIGH (ref 0.61–1.24)
GFR, Estimated: 19 mL/min — ABNORMAL LOW (ref >60)
Glucose, Bld: 149 mg/dL — ABNORMAL HIGH (ref 70–99)
Potassium: 4.3 mmol/L (ref 3.5–5.1)
Sodium: 145 mmol/L (ref 135–145)
Total Bilirubin: 1.1 mg/dL (ref 0.3–1.2)
Total Protein: 5.8 g/dL — ABNORMAL LOW (ref 6.5–8.1)

## 2020-09-14 LAB — HEPARIN LEVEL (UNFRACTIONATED)
Heparin Unfractionated: 1.11 IU/mL — ABNORMAL HIGH (ref 0.30–0.70)
Heparin Unfractionated: 0.9 IU/mL — ABNORMAL HIGH (ref 0.30–0.70)

## 2020-09-14 LAB — HEMOGLOBIN A1C
Hgb A1c MFr Bld: 12.2 % — ABNORMAL HIGH (ref 4.8–5.6)
Mean Plasma Glucose: 303.44 mg/dL

## 2020-09-14 LAB — MAGNESIUM: Magnesium: 2.2 mg/dL (ref 1.7–2.4)

## 2020-09-14 LAB — GLUCOSE, CAPILLARY
Glucose-Capillary: 324 mg/dL — ABNORMAL HIGH (ref 70–99)
Glucose-Capillary: 139 mg/dL — ABNORMAL HIGH (ref 70–99)
Glucose-Capillary: 201 mg/dL — ABNORMAL HIGH (ref 70–99)
Glucose-Capillary: 198 mg/dL — ABNORMAL HIGH (ref 70–99)
Glucose-Capillary: 165 mg/dL — ABNORMAL HIGH (ref 70–99)
Glucose-Capillary: 196 mg/dL — ABNORMAL HIGH (ref 70–99)

## 2020-09-14 LAB — C-REACTIVE PROTEIN: CRP: 6.3 mg/dL — ABNORMAL HIGH (ref <1.0)

## 2020-09-14 LAB — TRIGLYCERIDES: Triglycerides: 193 mg/dL — ABNORMAL HIGH

## 2020-09-14 LAB — PHOSPHORUS: Phosphorus: 6.5 mg/dL — ABNORMAL HIGH (ref 2.5–4.6)

## 2020-09-14 LAB — PROCALCITONIN: Procalcitonin: 0.78 ng/mL

## 2020-09-14 LAB — D-DIMER, QUANTITATIVE: D-Dimer, Quant: >20 ug/mL-FEU — ABNORMAL HIGH (ref 0.00–0.50)

## 2020-09-14 LAB — FERRITIN: Ferritin: 1114 ng/mL — ABNORMAL HIGH (ref 24–336)

## 2020-09-14 MED ORDER — MIDAZOLAM HCL 2 MG/2ML IJ SOLN: 4.0000 mg | Freq: Once | INTRAMUSCULAR | Status: AC

## 2020-09-14 MED ORDER — MIDAZOLAM HCL 2 MG/2ML IJ SOLN
INTRAMUSCULAR | Status: AC
  Administered 2020-09-14: 4 mg via INTRAVENOUS
  Filled 2020-09-14: qty 4

## 2020-09-14 MED ORDER — FENTANYL CITRATE (PF) 100 MCG/2ML IJ SOLN
INTRAMUSCULAR | Status: AC
  Administered 2020-09-14: 100 ug via INTRAVENOUS
  Filled 2020-09-14: qty 2

## 2020-09-14 MED ORDER — PROPOFOL 1000 MG/100ML IV EMUL
INTRAVENOUS | Status: AC
  Administered 2020-09-14: 20 ug/kg/min via INTRAVENOUS
  Filled 2020-09-14: qty 100

## 2020-09-14 MED ORDER — SODIUM CHLORIDE 0.9 % IV BOLUS
500.0000 mL | Freq: Once | INTRAVENOUS | Status: AC
  Administered 2020-09-14: 500 mL via INTRAVENOUS

## 2020-09-14 MED ORDER — HEPARIN (PORCINE) 25000 UT/250ML-% IV SOLN
1100.0000 [IU]/h | INTRAVENOUS | Status: DC
  Administered 2020-09-14: 1300 [IU]/h via INTRAVENOUS
  Administered 2020-09-15: 1100 [IU]/h via INTRAVENOUS
  Filled 2020-09-14 (×2): qty 250

## 2020-09-14 MED ORDER — VECURONIUM BROMIDE 10 MG IV SOLR
INTRAVENOUS | Status: AC
  Administered 2020-09-14: 20 mg via INTRAVENOUS
  Filled 2020-09-14: qty 20

## 2020-09-14 MED ORDER — FENTANYL CITRATE (PF) 100 MCG/2ML IJ SOLN
100.0000 ug | Freq: Once | INTRAMUSCULAR | Status: AC
Start: 2020-09-14 — End: 2020-09-14

## 2020-09-14 MED ORDER — VECURONIUM BROMIDE 10 MG IV SOLR: 20.0000 mg | Freq: Once | INTRAVENOUS | Status: AC

## 2020-09-14 MED ORDER — FENTANYL 2500MCG IN NS 250ML (10MCG/ML) PREMIX INFUSION
0.0000 ug/h | INTRAVENOUS | Status: DC
  Administered 2020-09-14: 25 ug/h via INTRAVENOUS
  Filled 2020-09-14: qty 250

## 2020-09-14 MED ORDER — PROPOFOL 1000 MG/100ML IV EMUL
5.0000 ug/kg/min | INTRAVENOUS | Status: DC
  Administered 2020-09-14: 20 ug/kg/min via INTRAVENOUS
  Administered 2020-09-15 (×2): 10 ug/kg/min via INTRAVENOUS
  Filled 2020-09-14 (×3): qty 100

## 2020-09-14 NOTE — Progress Notes (Signed)
[facility] Progress Note  Patient: Steven Gallegos    DZH:[id number]  DOA: 09/19/2020     Date of Service: the patient was seen and examined on 09/14/2020  Chief Complaint  Patient presents with  . Respiratory Distress   Brief hospital course: Steven Gallegos is a 75 y.o. male with medical history significant of class I obesity, hypertension, osteoarthritis, diabetic peripheral neuropathy, type 2 diabetes, spinal stenosis who is coming to the emergency department with complaints of progressively worse dyspnea, fever, chills, night sweats, fatigue, malaise, myalgias/arthralgias, decreased appetite, sore throat, nonproductive cough for about a week. He has no known exposures to COVID-19 and had the [employer] vaccine back in September. Denies chest pain, palpitations, dizziness, diaphoresis, PND orthopnea. No abdominal pain, nausea, vomiting, diarrhea, constipation, melena or hematochezia. Denies dysuria, frequency or materia. Denies polyuria, polydipsia, polyphagia or blurred vision.  ED Course: Initial vital signs were temperature 98.1 F, pulse 99, respirations 30, BP 132/84 mmHg O2 sat 92% and normal high flow nasal cannula. The patient received a 1000 mL LR bolus in the ED. I added another 1000 mL of LR bolus, 20 units of insulin SQ and begun COVID-19 treatment protocol.  Labwork: Urinalysis showed glucosuria more than 500, ketonuria 20 and proteinuria 100 mg/dL. CBC was normal with a hemoglobin of 17.0 g/dL. PT/INR/PTT within expected values. Venous blood gas showed a pH of 7.35, PCO2 of 37 mmHg acid base deficit 4.6 mmol/L. Lactic acid was 2.9 then 6.4 than 4.2 mmol/L. Magnesium level was not added on and drawn while the patient was receiving the infusion and resulted in 12.5 mg/dL. Phosphorus and corrected to albumin and calcium were normal. Potassium is 3.3, and CO2 20 mmol/L. Anion gap was 16. Glucose 330, BUN 29 and creatinine 0.88 mg/dL. Total protein 6.0, albumin 2.3  g/dL.   Assessment and Plan: Principal Problem:   Pneumonia due to COVID-19 virus Patient is unvaccinated Continue supplemental oxygen. Incentive spirometry and flutter valve. Bronchodilators as needed. Analgesics as needed. Antiemetics as needed. Antitussives as needed. 1/6---1/10  Remdesivir for 5 days as per pharmacy. Dexamethasone 6 mg IVP daily. 1/6 s/p Actemra per pharmacy x 1 dose given Vitamin D 50,000 units x 1 dose. Monitor CBC, CMP and inflammatory markers. Blood culture 1/4 growing GPC possible contamination Antibiotic discontinued, we will continue to follow final report of blood culture  1/8 s/p Lasix 40 mg IV twice daily to diurese, 1/9 currently patient is using continuous BiPAP as he was unable to tolerate nonrebreather due to excessive work of breathing 1/10 discontinued Lasix secondary to renal failure Today patient's condition deteriorated, he had very weak cough and struggling to remove his secretions, work of breathing was increased and using accessory muscles.  PCCM discussed with patient's brother, patient does not want to be on mechanical ventilation so probably he will be DNR/DNI but awaiting to estranged daughters to make the final decision.   Active Problems: A. Fib with RVR, new developed on 09/12/2020 overnight Continue Cardizem IV infusion If persistent A. fib, will consider cardiology consult CHADS2 =3     Type 2 diabetes mellitus (HCC) Carbohydrate modified diet. Tradjenta per Covid protocol. Levemir per Covid protocol. CBG monitoring with RI SS before meals.    Hypertension Monitor blood pressure. As needed antihypertensive.    Class 1 obesity Lifestyle modification Follow-up with PCP.    Protein-calorie malnutrition, severe (HCC) Poor prognostic factor for Covid. Protein supplementation 3 times daily.    Peripheral neuropathy Continue Neurontin 900 mg p.o.  daily.   Body mass index is 32.14 kg/m.        Diet: Heart  healthy/carb modified DVT Prophylaxis: Subcutaneous Lovenox   Advance goals of care discussion: Full code  Family Communication: family was not present at bedside, at the time of interview.  The pt provided permission to discuss medical plan with the family. Opportunity was given to ask question and all questions were answered satisfactorily.   Disposition:  Pt is from Home, admitted with acute hyper respiratory failure secondary to COVID-19 pneumonia, still has severe respiratory failure, which precludes a safe discharge. Discharge to home versus SNF depends on recovery, when recovers from COVID-19 pneumonia. 1/9 currently patient is using continuous BiPAP as he was unable to tolerate nonrebreather due to excessive work of breathing 1/10 patient's condition got worse, may need intubation but patient does not want to be on ventilators, most probably he will be DNR/DNI, awaiting for 2 estranged daughters to make the final decision most likely patient will be comfort care.   Subjective: Since last night patient's condition is deteriorating, patient remained on BiPAP, transition to HFNC nonrebreather mask, 55 L with 100% FiO2 Patient had weak cough unable to get out of his secretions, work of breathing increased and using incentive muscles.  Patient may need intubation but he does not want to be on mechanical ventilator so most likely he will be DNR/DNI and comfort care, awaiting for 2 estranged daughters for final decision.  Discussion was done by PCCM in the ICU with patient's brother.   Physical Exam: General: Short of breath, on BiPAP, very lethargic Eyes: PERRLA ENT: Oral Mucosa Clear, moist  Neck: no JVD,  Cardiovascular: S1 and S2 Present, no Murmur,  Respiratory: increased respiratory effort, Bilateral Air entry equal and Decreased, significant bilateral crackles, no wheezing appreciated.  Abdomen: Bowel Sound present, Soft and no tenderness,  Skin: no Rashes Extremities: no Pedal  edema, no calf tenderness Neurologic: without any new focal findings Gait not checked due to patient safety concerns  Vitals:   09/14/20 0800 09/14/20 0828 09/14/20 0845 09/14/20 0900  BP: (!) 146/120   (!) 147/75  Pulse: 93  93   Resp: (!) 24  (!) 25 (!) 26  Temp: (!) 93.7 F (34.3 C)     TempSrc: Axillary     SpO2: 100% 92% 92%   Weight:      Height:        Intake/Output Summary (Last 24 hours) at 09/14/2020 1516 Last data filed at 09/14/2020 3354 Gross per 24 hour  Intake 180.77 ml  Output 765 ml  Net -584.23 ml   Filed Weights   2020-09-11 1751  Weight: 119.7 kg    Data Reviewed: I have personally reviewed and interpreted daily labs, tele strips, imagings as discussed above. I reviewed all nursing notes, pharmacy notes, vitals, pertinent old records I have discussed plan of care as described above with RN and patient/family.  CBC: Recent Labs  Lab 09/11/2020 1752 09/11/20 0422 09/12/20 0409 09/13/20 0723 09/14/20 0222  WBC 8.5 6.3 7.3 16.4* 13.1*  NEUTROABS 7.5 5.3 6.1 14.2* 11.6*  HGB 17.0 15.1 14.8 16.3 16.7  HCT 49.6 43.6 42.7 48.0 48.7  MCV 86.1 85.0 85.2 86.8 85.7  PLT 259 223 167 130* 115*   Basic Metabolic Panel: Recent Labs  Lab Sep 11, 2020 1925 09/11/2020 2146 09-11-20 2146 09/11/20 0038 09/11/20 0422 09/12/20 0409 09/13/20 0723 09/14/20 0222  NA 144  --   --   --  145 144 140 145  K 3.3*  --   --   --  3.9 4.0 4.7 4.3  CL 108  --   --   --  113* 110 107 107  CO2 20*  --   --   --  23 23 18* 22  GLUCOSE 330*  --   --   --  197* 328* 365* 149*  BUN 29*  --   --   --  25* 28* 55* 75*  CREATININE 0.88  --   --   --  0.54* 0.66 2.18* 3.33*  CALCIUM 8.1*  --   --   --  8.4* 8.4* 8.5* 8.3*  MG  --  12.5*   < > 2.4 2.4 2.2 2.1 2.2  PHOS  --  2.7  --   --  2.3* 3.9 6.9* 6.5*   < > = values in this interval not displayed.    Studies: No results found.  Scheduled Meds: . albuterol  2 puff Inhalation Q6H  . vitamin C  500 mg Oral Daily  . aspirin  EC  81 mg Oral Daily  . chlorhexidine  15 mL Mouth Rinse BID  . Chlorhexidine Gluconate Cloth  6 each Topical Daily  . dexamethasone (DECADRON) injection  6 mg Intravenous Q24H  . fentaNYL      . gabapentin  900 mg Oral QHS  . insulin aspart  0-20 Units Subcutaneous Q4H  . insulin detemir  26 Units Subcutaneous BID  . linagliptin  5 mg Oral Daily  . mouth rinse  15 mL Mouth Rinse q12n4p  . melatonin  3 mg Oral QHS  . midazolam      . pantoprazole (PROTONIX) IV  40 mg Intravenous Q24H  . vecuronium      . vitamin B-12  1,000 mcg Oral Daily  . Vitamin D (Ergocalciferol)  50,000 Units Oral Q7 days  . zinc sulfate  220 mg Oral Daily   Continuous Infusions: . diltiazem (CARDIZEM) infusion 5 mg/hr (09/14/20 0800)  . heparin 1,300 Units/hr (09/14/20 0532)   PRN Meds: acetaminophen **OR** acetaminophen, chlorpheniramine-HYDROcodone, guaiFENesin-dextromethorphan, morphine injection, ondansetron **OR** ondansetron (ZOFRAN) IV  Time spent: 35 minutes  Author: Gillis Santa. MD [facility] 09/14/2020 3:16 PM  To reach On-call, see care teams to locate the attending and reach out to them via [email or web address]. If 7PM-7AM, please contact night-coverage If you still have difficulty reaching the attending provider, please page the [facility] (Director on Call) for [facility] on amion for assistance.

## 2020-09-14 NOTE — Progress Notes (Signed)
GOALS OF CARE DISCUSSION  The Clinical status was relayed to family in detail. Brother Doug at bedside   Updated and notified of patients medical condition.  Patient is having a weak cough and struggling to remove secretions.   patient with increased WOB and using accessory muscles to breathe Explained to family course of therapy and the modalities     Patient with Progressive multiorgan failure with very low chance of meaningful recovery despite all aggressive and optimal medical therapy. Patient is in the Dying  Process associated with Suffering.  Brother Gala Romney understands the situation. Patient would NOT want to live on machines and does NOT want to suffer  He has consented and agreed to DNR/DNI however patient has 2 estranged daughters and they will need to be notified and relinquish medica decision and rights.  Family are satisfied with Plan of action and management. All questions answered  Additional CC time 32 mins   Sakina Briones Santiago Glad, M.D.  [facility]  Medical Director [facility] Medical Director [facility] Cardio-Pulmonary Department

## 2020-09-14 NOTE — Progress Notes (Signed)
ANTICOAGULATION CONSULT NOTE  Pharmacy Consult for Heparin Infusion  Indication: atrial fibrillation  Allergies  Allergen Reactions  . Flexeril [Cyclobenzaprine] Other (See Comments)    Reaction:  Pt gets jittery    Patient Measurements: Height: 6\' 4"  (193 cm) Weight: 119.7 kg (264 lb) IBW/kg (Calculated) : 86.8 Heparin Dosing Weight: 110 kg  Vital Signs: Temp: 93.7 F (34.3 C) (01/10 0800) Temp Source: Axillary (01/10 0800) BP: 147/75 (01/10 0900) Pulse Rate: 93 (01/10 0845)  Labs: Recent Labs    09/12/20 0409 09/13/20 0723 09/14/20 0222 09/14/20 1311  HGB 14.8 16.3 16.7  --   HCT 42.7 48.0 48.7  --   PLT 167 130* 115*  --   LABPROT  --  22.0*  --   --   INR  --  2.0*  --   --   HEPARINUNFRC  --   --  1.11* 0.90*  CREATININE 0.66 2.18* 3.33*  --     Estimated Creatinine Clearance: 27.9 mL/min (A) (by C-G formula based on SCr of 3.33 mg/dL (H)).   Medical History: Past Medical History:  Diagnosis Date  . Atrial fibrillation (HCC)   . Class 1 obesity   . Hypertension   . Osteoarthritis   . Peripheral neuropathy   . Spinal stenosis   . Type 2 diabetes mellitus (HCC) 09/27/2020     Assessment: 75 yo male with PMH of HTN, DM, Peripheral neuropathy and OA. Patient admitted pneumonia due to COVID-19 virus. Patient was started on enoxaparin treatment dosing on 1/9 @ 0530 for new A. Fib w/RVR. [name] Scr has increased from 0.66 to 2.18. Due to acute renal failure decision was made to transition patient from therapeutic enoxaparin to heparin infusion.    01/10 0222 HL 1.11 01/10 1311 HL 0.90  Goal of Therapy:   Heparin level 0.3-0.7 units/ml Monitor platelets by anticoagulation protocol: Yes    Plan:  Heparin level supratherapeutic. Decrease heparin drip to 1100 units/hr. Recheck HL 1/11 at 0000. CBC with morning labs.  3/11, PharmD 09/14/2020 2:47 PM

## 2020-09-14 NOTE — Progress Notes (Signed)
CRITICAL CARE NOTE 45 yod male with COVID-19 Pneumonia admitted to PCU initially on HHFNC, ultimately requiring BiPAP due to acute hypoxic respiratory failure and increased work of breathing with admission to ICU.  Significant Hospital Events:  09/13/2020-admit to [facility] service on HFNC, Covid positive 09/12/2020-transferred to PCU on HHFNC 09/13/2020-transferred to ICU, as stepdown patient on BiPAP   1/10 patient on biPAP  CC  follow up respiratory failure  SUBJECTIVE Patient remains critically ill Prognosis is guarded patient is suffocating and suffering on biPAP  BP (!) 147/75   Pulse 93   Temp (!) 93.7 F (34.3 C) (Axillary) Comment: Provided pt with warm blankets  Resp (!) 26   Ht 6\' 4"  (1.93 m)   Wt 119.7 kg   SpO2 92%   BMI 32.14 kg/m    I/O last 3 completed shifts: In: 770 [P.O.:670; IV Piggyback:100] Out: 1435 [Urine:1435] Total I/O In: 120.8 [I.V.:20.8; IV Piggyback:100] Out: 175 [Urine:175]  SpO2: 92 % O2 Flow Rate (L/min): 55 L/min FiO2 (%): 100 %  Estimated body mass index is 32.14 kg/m as calculated from the following:   Height as of this encounter: 6\' 4"  (1.93 m).   Weight as of this encounter: 119.7 kg.  SIGNIFICANT EVENTS   REVIEW OF SYSTEMS  PATIENT IS UNABLE TO PROVIDE COMPLETE REVIEW OF SYSTEMS DUE TO SEVERE CRITICAL ILLNESS      COVID-19 DISASTER DECLARATION:   FULL CONTACT PHYSICAL EXAMINATION WAS NOT POSSIBLE DUE TO TREATMENT OF COVID-19  AND CONSERVATION OF PERSONAL PROTECTIVE EQUIPMENT, LIMITED EXAM FINDINGS INCLUDE-   PHYSICAL EXAMINATION:  GENERAL:critically ill appearing, +resp distress NEUROLOGIC: agitated and confused   Patient assessed or the symptoms described in the history of present illness.  In the context of the Global COVID-19 pandemic, which necessitated consideration that the patient might be at risk for infection with the SARS-CoV-2 virus that causes COVID-19, Institutional protocols and algorithms that pertain to the  evaluation of patients at risk for COVID-19 are in a state of rapid change based on information released by regulatory bodies including the CDC and federal and state organizations. These policies and algorithms were followed during the patient's care while in hospital.    MEDICATIONS: I have reviewed all medications and confirmed regimen as documented   CULTURE RESULTS   Recent Results (from the past 240 hour(s))  Blood Culture (routine x 2)     Status: None (Preliminary result)   Collection Time: 09/27/2020  5:57 PM   Specimen: BLOOD  Result Value Ref Range Status   Specimen Description BLOOD LEFT ANTECUBITAL  Final   Special Requests   Final    BOTTLES DRAWN AEROBIC AND ANAEROBIC Blood Culture adequate volume   Culture  Setup Time   Final    Organism ID to follow GRAM POSITIVE COCCI AEROBIC BOTTLE ONLY CRITICAL RESULT CALLED TO, READ BACK BY AND VERIFIED WITH: AMY THOMPSON AT 1300 09/11/20.PMF Performed at [facility], [address] [place], [address] [place]    Culture [facility] POSITIVE COCCI  Final   Report Status PENDING  Incomplete  Blood Culture ID Panel (Reflexed)     Status: Abnormal   Collection Time: 10/05/2020  5:57 PM  Result Value Ref Range Status   Enterococcus faecalis NOT DETECTED NOT DETECTED Final   Enterococcus Faecium NOT DETECTED NOT DETECTED Final   Listeria monocytogenes NOT DETECTED NOT DETECTED Final   Staphylococcus species DETECTED (A) NOT DETECTED Final    Comment: CRITICAL RESULT CALLED TO, READ BACK BY AND VERIFIED WITH:  AMY THOMPSON AT 1300 09/11/20.PMF    Staphylococcus aureus (BCID) NOT DETECTED NOT DETECTED Final   Staphylococcus epidermidis NOT DETECTED NOT DETECTED Final   Staphylococcus lugdunensis NOT DETECTED NOT DETECTED Final   Streptococcus species NOT DETECTED NOT DETECTED Final   Streptococcus agalactiae NOT DETECTED NOT DETECTED Final   Streptococcus pneumoniae NOT DETECTED NOT DETECTED Final   Streptococcus pyogenes NOT  DETECTED NOT DETECTED Final   A.calcoaceticus-baumannii NOT DETECTED NOT DETECTED Final   Bacteroides fragilis NOT DETECTED NOT DETECTED Final   Enterobacterales NOT DETECTED NOT DETECTED Final   Enterobacter cloacae complex NOT DETECTED NOT DETECTED Final   Escherichia coli NOT DETECTED NOT DETECTED Final   Klebsiella aerogenes NOT DETECTED NOT DETECTED Final   Klebsiella oxytoca NOT DETECTED NOT DETECTED Final   Klebsiella pneumoniae NOT DETECTED NOT DETECTED Final   Proteus species NOT DETECTED NOT DETECTED Final   Salmonella species NOT DETECTED NOT DETECTED Final   Serratia marcescens NOT DETECTED NOT DETECTED Final   Haemophilus influenzae NOT DETECTED NOT DETECTED Final   Neisseria meningitidis NOT DETECTED NOT DETECTED Final   Pseudomonas aeruginosa NOT DETECTED NOT DETECTED Final   Stenotrophomonas maltophilia NOT DETECTED NOT DETECTED Final   Candida albicans NOT DETECTED NOT DETECTED Final   Candida auris NOT DETECTED NOT DETECTED Final   Candida glabrata NOT DETECTED NOT DETECTED Final   Candida krusei NOT DETECTED NOT DETECTED Final   Candida parapsilosis NOT DETECTED NOT DETECTED Final   Candida tropicalis NOT DETECTED NOT DETECTED Final   Cryptococcus neoformans/gattii NOT DETECTED NOT DETECTED Final    Comment: Performed at [facility], [address] [place], [place] [address]  Blood Culture (routine x 2)     Status: None (Preliminary result)   Collection Time: 09-16-20  5:58 PM   Specimen: BLOOD  Result Value Ref Range Status   Specimen Description BLOOD BLOOD LEFT HAND  Final   Special Requests   Final    BOTTLES DRAWN AEROBIC AND ANAEROBIC Blood Culture adequate volume   Culture   Final    NO GROWTH 4 DAYS Performed at [facility], [address] [place], [place] [address]    Report Status PENDING  Incomplete  Urine culture     Status: Abnormal   Collection Time: 09-16-20  7:25 PM   Specimen: Urine, Random  Result Value Ref Range  Status   Specimen Description   Final    URINE, RANDOM Performed at [facility], [address], [place], [place] [address]    Special Requests   Final    NONE Performed at [facility], [address] [place], [place] [address]    Culture MULTIPLE SPECIES PRESENT, SUGGEST RECOLLECTION (A)  Final   Report Status 09/12/2020 FINAL  Final          IMAGING    No results found.   Nutrition Status:           Indwelling Urinary Catheter continued, requirement due to   Reason to continue Indwelling Urinary Catheter strict Intake/Output monitoring for hemodynamic instability   Central Line/ continued, requirement due to  Reason to continue [employer] Monitoring of central venous pressure or other hemodynamic parameters and poor IV access   Ventilator continued, requirement due to severe respiratory failure   Ventilator Sedation RASS 0 to -2      ASSESSMENT AND PLAN SYNOPSIS  SEVERE ARDS AND HYPOXIC RESP FAILURE DUE TO COVID 19 PNEUMONIA Patient on biPAP Patient is struggling to breathe  Will need to establish plan of care as patient is near cardiac arrest Continue BiPAP IV steroids   ELECTROLYTES -follow labs as needed -replace as needed -pharmacy consultation and following   DVT/GI PRX ordered and assessed TRANSFUSIONS AS NEEDED MONITOR FSBS I Assessed the need for Labs I Assessed the need for Foley I Assessed the need for Central Venous Line Family Discussion when available I Assessed the need for Mobilization I made an Assessment of medications to be adjusted accordingly Safety Risk assessment completed   CASE DISCUSSED IN MULTIDISCIPLINARY ROUNDS WITH ICU TEAM  Critical Care Time devoted to patient care services described in this note is 45  minutes.   Overall, patient is critically ill, prognosis is guarded.  Patient with Multiorgan failure and at high risk for cardiac arrest and death.   Will need to contact family for  decisions Lucie Leather, M.D.  [facility]  Medical Director [facility] Medical Director [facility] Cardio-Pulmonary Department

## 2020-09-14 NOTE — Progress Notes (Signed)
CH followed up from visit this AM from [facility]; [facility] spoke w/pt.'s brother and pt.'s RN re: getting AD completed for pt.; he explained that notarization of AD will not be possible due to limitations re: witnesses coming to be present at pt.'s rm, as he is COVID+.  CH reiterated this info, suggesting RN staff record any wishes pt. verbalizes in chart notes.  Chaplains remain available as needed to assist in this situation.

## 2020-09-14 NOTE — Procedures (Signed)
Endotracheal Intubation: Patient required placement of an artificial airway secondary to Respiratory Failure  Consent: Emergent.   Hand washing performed prior to starting the procedure.   Medications administered for sedation prior to procedure:  Midazolam 4 mg IV,  Vecuronium 20 mg IV, Fentanyl 200 mcg IV.    A time out procedure was called and correct patient, name, & ID confirmed. Needed supplies and equipment were assembled and checked to include ETT, 10 ml syringe, Glidescope, Mac and Miller blades, suction, oxygen and bag mask valve, end tidal CO2 monitor.   Patient was positioned to align the mouth and pharynx to facilitate visualization of the glottis.   Heart rate, SpO2 and blood pressure was continuously monitored during the procedure. Pre-oxygenation was conducted prior to intubation and endotracheal tube was placed through the vocal cords into the trachea.     The artificial airway was placed under direct visualization via glidescope route using a 8.0 ETT on the first attempt.  ETT was secured at 23 cm mark.  Placement was confirmed by auscuitation of lungs with good breath sounds bilaterally and no stomach sounds.  Condensation was noted on endotracheal tube.   Pulse ox 98%.  CO2 detector in place with appropriate color change.   Complications: None .   Operator: Hansford Hirt.   Chest radiograph ordered and pending.    Corrin Parker, M.D.  [facility]  Medical Director [place] Director [facility] Cardio-Pulmonary Department

## 2020-09-14 NOTE — Progress Notes (Signed)
ANTICOAGULATION CONSULT NOTE - Initial Consult  Pharmacy Consult for Heparin Infusion  Indication: atrial fibrillation  Allergies  Allergen Reactions  . Flexeril [Cyclobenzaprine] Other (See Comments)    Reaction:  Pt gets jittery    Patient Measurements: Height: 6\' 4"  (193 cm) Weight: 119.7 kg (264 lb) IBW/kg (Calculated) : 86.8 Heparin Dosing Weight: 110 kg  Vital Signs: Temp: 97 F (36.1 C) (01/10 0400) Temp Source: Axillary (01/10 0400) BP: 127/74 (01/10 0400) Pulse Rate: 61 (01/10 0000)  Labs: Recent Labs    09/12/20 0409 09/13/20 0723 09/14/20 0222  HGB 14.8 16.3 16.7  HCT 42.7 48.0 48.7  PLT 167 130* 115*  LABPROT  --  22.0*  --   INR  --  2.0*  --   HEPARINUNFRC  --   --  1.11*  CREATININE 0.66 2.18* 3.33*    Estimated Creatinine Clearance: 27.9 mL/min (A) (by C-G formula based on SCr of 3.33 mg/dL (H)).   Medical History: Past Medical History:  Diagnosis Date  . Atrial fibrillation (HCC)   . Class 1 obesity   . Hypertension   . Osteoarthritis   . Peripheral neuropathy   . Spinal stenosis   . Type 2 diabetes mellitus (HCC) 2020-09-18     Assessment: 75 yo male with PMH of HTN, DM, Peripheral neuropathy and OA. Patient admitted pneumonia due to COVID-19 virus. Patient was started on enoxaparin treatment dosing on 1/9 @ 0530 for new A. Fib w/RVR. [name] Scr has increased from 0.66 to 2.18. Due to acute renal failure decision was made to transition patient from therapeutic enoxaparin to heparin infusion.    01/10 0222 HL = 1.11  Goal of Therapy:   Heparin level 0.3-0.7 units/ml Monitor platelets by anticoagulation protocol: Yes    Plan:  Contacted RN.  Stop and hold heparin infusion for 1 hour. Restart heparin infusion at 1300 units/hr @ 0530 this AM. Check anti-Xa level in 8 hours and daily while on heparin Continue to monitor H&H and platelets  03/10, PharmD, [facility] 09/14/2020 4:33 AM

## 2020-09-14 NOTE — Procedures (Signed)
Central Venous Catheter Placement:TRIPLE LUMEN   Procedure: Insertion of Non-tunneled Central Venous Catheter(36556) with US guidance (76937)   Indication(s) Medication administration and Difficult access  CVP monitoring Patient receiving vesicant or irritant drug.; Patient receiving intravenous therapy for longer than 5 days.; Patient has limited or no vascular access.    Consent Risks of the procedure as well as the alternatives and risks of each were explained to the patient and/or caregiver.  Consent for the procedure was obtained and is signed in the bedside chart  Consent:emergent   Anesthesia Topical only with 1% lidocaine    Timeout Verified patient identification, verified procedure, site/side was marked, verified correct patient position, special equipment/implants available, medications/allergies/relevant history reviewed, required imaging and test results available. Patient comfort was obtained.     Sterile Technique Maximal sterile technique including full sterile barrier drape, hand hygiene, sterile gown, sterile gloves, mask, hair covering, sterile ultrasound probe cover (if used).   Hand washing performed prior to starting the procedure.    Procedure Description Area of catheter insertion was cleaned with chlorhexidine and draped in sterile fashion.  With real-time ultrasound guidance a central venous catheter was placed into the right internal jugular vein. Nonpulsatile blood flow and easy flushing noted in all ports.  The catheter was sutured in place and sterile dressing applied.   A triple lumen catheter was placed in RT Internal Jugular Vein There was good blood return, catheter caps were placed on lumens, catheter flushed easily, the line was secured and a sterile dressing and BIO-PATCH applied.    Complications/Tolerance None; patient tolerated the procedure well. Chest X-ray is ordered to verify placement     EBL Minimal    Specimen(s) None   Number of Attempts: 1 Complications:none Estimated Blood Loss: none Chest Radiograph indicated and ordered.  Operator: Bannon Giammarco.   Steven Gallegos, M.D.  [place]  Medical Director [facility] [place] Medical Director [facility] Cardio-Pulmonary Department     

## 2020-09-14 NOTE — Progress Notes (Signed)
Pt remains on BIPAP overnight, 100%. Poor read/signal on o2 probe, reading 95% and above. Foley remains in place, putting out 60-80 ml q2 hours. Elevated Cr this AM, NP notified and aware. PRN morphine given for increased WOB + chronic pain management. Pt remains oriented to self and place. Mouthcare provided while on BIPAP. PO meds helds d/t increased risk for aspiration. Dilt gtt started overnight d/t HR > 105. Pt remains on Heparin gtt, rate changed this AM. Will continue to monitor.

## 2020-09-14 NOTE — Plan of Care (Signed)
  Problem: Clinical Measurements: Goal: Respiratory complications will improve Outcome: Progressing   Problem: Clinical Measurements: Goal: Cardiovascular complication will be avoided Outcome: Progressing   Problem: Pain Managment: Goal: General experience of comfort will improve Outcome: Progressing   Problem: Respiratory: Goal: Will maintain a patent airway Outcome: Progressing  Neuro: Pt sedated and unable to follow commands at this time, but withdrawals from pain.   Respiratory: Pt was on BiPAP for most of the day and experienced increasing tachypnea. Pt placed on ventilator. O2 sats WNL at this time with vent settings of PRVC 60%/5/28/500. Last blood gas results below. Will continue to monitor.   Results for DANE, KOPKE (MRN [id number]) as of 09/14/2020 18:34  Ref. Range 09/14/2020 17:29  Sample type Unknown ARTERIAL DRAW  Delivery systems Unknown VENTILATOR  FIO2 Unknown 1.00  Mode Unknown PRESSURE REGULATED VOLUME CONTROL  VT Latest Units: mL 500  Peep/cpap Latest Units: cm H20 15.0  pH, Arterial Latest Ref Range: 7.350 - 7.450  7.10 (LL)  pCO2 arterial Latest Ref Range: 32.0 - 48.0 mmHg 70 (HH)  pO2, Arterial Latest Ref Range: 83.0 - 108.0 mmHg 396 (H)  Acid-base deficit Latest Ref Range: 0.0 - 2.0 mmol/L 9.6 (H)  Bicarbonate Latest Ref Range: 20.0 - 28.0 mmol/L 21.7  O2 Saturation Latest Units: % 99.9  Patient temperature Unknown 37.0  Collection site Unknown RIGHT RADIAL     Cardiovascular: Pt remains in Afib with frequent PVCs throughout the day. Pt remains on diltiazem drip at 5mg /hr.  BP WNL and not requiring any vasopressors at this time. All pulses present and palpable. Pt afebrile throughout shift.   GI/GU: Pt with foley in place with low urine output. Pt with of urine output throughout shift. Last BM today. Pt with NG tube in place, but not yet read from radiology. NG tube at 65cm and clamped until placement verified.   Skin: Skin intact with no  s/s of skin breakdown at this time. Pt continues to be a turn.  Pain: Pt with no signs of pain, Fentanyl infusion on 5mcg/hr continued.  Events: Pt with increasing tachypnea and intermittently confused throughout day. Pt was on BiPAP with FiO2 of 100%. Md intubated pt with RT at bedside with no complications. Pts o2 sats WNL at this time and pt appears to be comfortable. Family updated and no further questions at this time.

## 2020-09-15 DIAGNOSIS — J9601 Acute respiratory failure with hypoxia: Secondary | ICD-10-CM | POA: Diagnosis not present

## 2020-09-15 DIAGNOSIS — U071 COVID-19: Secondary | ICD-10-CM | POA: Diagnosis not present

## 2020-09-15 DIAGNOSIS — E43 Unspecified severe protein-calorie malnutrition: Secondary | ICD-10-CM | POA: Diagnosis not present

## 2020-09-15 DIAGNOSIS — J1282 Pneumonia due to coronavirus disease 2019: Secondary | ICD-10-CM

## 2020-09-15 LAB — COMPREHENSIVE METABOLIC PANEL
ALT: 29 U/L (ref 0–44)
AST: 32 U/L (ref 15–41)
Albumin: 2.6 g/dL — ABNORMAL LOW (ref 3.5–5.0)
Alkaline Phosphatase: 260 U/L — ABNORMAL HIGH (ref 38–126)
Anion gap: 17 — ABNORMAL HIGH (ref 5–15)
BUN: 97 mg/dL — ABNORMAL HIGH (ref 8–23)
CO2: 21 mmol/L — ABNORMAL LOW (ref 22–32)
Calcium: 7.5 mg/dL — ABNORMAL LOW (ref 8.9–10.3)
Chloride: 107 mmol/L (ref 98–111)
Creatinine, Ser: 5.02 mg/dL — ABNORMAL HIGH (ref 0.61–1.24)
GFR, Estimated: 11 mL/min — ABNORMAL LOW (ref >60)
Glucose, Bld: 245 mg/dL — ABNORMAL HIGH (ref 70–99)
Potassium: 6.4 mmol/L (ref 3.5–5.1)
Sodium: 145 mmol/L (ref 135–145)
Total Bilirubin: 0.9 mg/dL (ref 0.3–1.2)
Total Protein: 5.7 g/dL — ABNORMAL LOW (ref 6.5–8.1)

## 2020-09-15 LAB — CBC WITH DIFFERENTIAL/PLATELET
Abs Immature Granulocytes: 0.16 10*3/uL — ABNORMAL HIGH (ref 0.00–0.07)
Basophils Absolute: 0 10*3/uL (ref 0.0–0.1)
Basophils Relative: 0 %
Eosinophils Absolute: 0 10*3/uL (ref 0.0–0.5)
Eosinophils Relative: 0 %
HCT: 46.4 % (ref 39.0–52.0)
Hemoglobin: 14.6 g/dL (ref 13.0–17.0)
Immature Granulocytes: 1 %
Lymphocytes Relative: 6 %
Lymphs Abs: 1 10*3/uL (ref 0.7–4.0)
MCH: 29 pg (ref 26.0–34.0)
MCHC: 31.5 g/dL (ref 30.0–36.0)
MCV: 92.2 fL (ref 80.0–100.0)
Monocytes Absolute: 0.5 10*3/uL (ref 0.1–1.0)
Monocytes Relative: 3 %
Neutro Abs: 14.8 10*3/uL — ABNORMAL HIGH (ref 1.7–7.7)
Neutrophils Relative %: 90 %
Platelets: 153 10*3/uL (ref 150–400)
RBC: 5.03 MIL/uL (ref 4.22–5.81)
RDW: 14.3 % (ref 11.5–15.5)
WBC: 16.5 10*3/uL — ABNORMAL HIGH (ref 4.0–10.5)
nRBC: 0.5 % — ABNORMAL HIGH (ref 0.0–0.2)

## 2020-09-15 LAB — GLUCOSE, CAPILLARY
Glucose-Capillary: 138 mg/dL — ABNORMAL HIGH (ref 70–99)
Glucose-Capillary: 162 mg/dL — ABNORMAL HIGH (ref 70–99)
Glucose-Capillary: 215 mg/dL — ABNORMAL HIGH (ref 70–99)
Glucose-Capillary: 220 mg/dL — ABNORMAL HIGH (ref 70–99)
Glucose-Capillary: 223 mg/dL — ABNORMAL HIGH (ref 70–99)

## 2020-09-15 LAB — CULTURE, BLOOD (ROUTINE X 2)
Culture: NO GROWTH
Special Requests: ADEQUATE
Special Requests: ADEQUATE

## 2020-09-15 LAB — BASIC METABOLIC PANEL
Anion gap: 18 — ABNORMAL HIGH (ref 5–15)
BUN: 98 mg/dL — ABNORMAL HIGH (ref 8–23)
CO2: 22 mmol/L (ref 22–32)
Calcium: 7.4 mg/dL — ABNORMAL LOW (ref 8.9–10.3)
Chloride: 105 mmol/L (ref 98–111)
Creatinine, Ser: 5.56 mg/dL — ABNORMAL HIGH (ref 0.61–1.24)
GFR, Estimated: 10 mL/min — ABNORMAL LOW (ref >60)
Glucose, Bld: 231 mg/dL — ABNORMAL HIGH (ref 70–99)
Potassium: 6.5 mmol/L (ref 3.5–5.1)
Sodium: 145 mmol/L (ref 135–145)

## 2020-09-15 LAB — HEPARIN LEVEL (UNFRACTIONATED)
Heparin Unfractionated: 0.52 IU/mL (ref 0.30–0.70)
Heparin Unfractionated: 0.58 IU/mL (ref 0.30–0.70)

## 2020-09-15 LAB — D-DIMER, QUANTITATIVE: D-Dimer, Quant: 17.13 ug/mL-FEU — ABNORMAL HIGH (ref 0.00–0.50)

## 2020-09-15 LAB — FERRITIN: Ferritin: 988 ng/mL — ABNORMAL HIGH (ref 24–336)

## 2020-09-15 LAB — CK: Total CK: 128 U/L (ref 49–397)

## 2020-09-15 LAB — MAGNESIUM: Magnesium: 2.3 mg/dL (ref 1.7–2.4)

## 2020-09-15 LAB — C-REACTIVE PROTEIN: CRP: 12 mg/dL — ABNORMAL HIGH (ref <1.0)

## 2020-09-15 LAB — PHOSPHORUS: Phosphorus: 12.7 mg/dL — ABNORMAL HIGH (ref 2.5–4.6)

## 2020-09-15 MED ORDER — DEXTROSE 5 % IV SOLN: INTRAVENOUS | Status: DC

## 2020-09-15 MED ORDER — ACETAMINOPHEN 325 MG PO TABS: 650.0000 mg | ORAL_TABLET | Freq: Four times a day (QID) | ORAL | Status: DC | PRN

## 2020-09-15 MED ORDER — GLYCOPYRROLATE 1 MG PO TABS
1.0000 mg | ORAL_TABLET | ORAL | Status: DC | PRN
  Filled 2020-09-15: qty 1

## 2020-09-15 MED ORDER — GABAPENTIN 300 MG PO CAPS: 900.0000 mg | ORAL_CAPSULE | Freq: Every day | ORAL | Status: DC

## 2020-09-15 MED ORDER — ONDANSETRON HCL 4 MG/2ML IJ SOLN: 4.0000 mg | Freq: Four times a day (QID) | INTRAMUSCULAR | Status: DC | PRN

## 2020-09-15 MED ORDER — MORPHINE BOLUS VIA INFUSION
5.0000 mg | INTRAVENOUS | Status: DC | PRN
Start: 2020-09-15 — End: 2020-09-16
  Filled 2020-09-15: qty 5

## 2020-09-15 MED ORDER — ACETAMINOPHEN 650 MG RE SUPP: 650.0000 mg | Freq: Four times a day (QID) | RECTAL | Status: DC | PRN

## 2020-09-15 MED ORDER — MELATONIN 3 MG PO TABS
3.0000 mg | ORAL_TABLET | Freq: Every day | ORAL | Status: DC
  Filled 2020-09-15 (×2): qty 1

## 2020-09-15 MED ORDER — MORPHINE 100MG IN NS 100ML (1MG/ML) PREMIX INFUSION
0.0000 mg/h | INTRAVENOUS | Status: DC
  Filled 2020-09-15: qty 100

## 2020-09-15 MED ORDER — MIDAZOLAM HCL 2 MG/2ML IJ SOLN
2.0000 mg | INTRAMUSCULAR | Status: DC | PRN
  Filled 2020-09-15: qty 4

## 2020-09-15 MED ORDER — LACTATED RINGERS IV BOLUS
500.0000 mL | Freq: Once | INTRAVENOUS | Status: AC
  Administered 2020-09-15: 500 mL via INTRAVENOUS

## 2020-09-15 MED ORDER — INSULIN ASPART 100 UNIT/ML ~~LOC~~ SOLN
10.0000 [IU] | Freq: Once | SUBCUTANEOUS | Status: AC
  Administered 2020-09-15: 10 [IU] via INTRAVENOUS
  Filled 2020-09-15: qty 1
  Filled 2020-09-15: qty 0.1

## 2020-09-15 MED ORDER — VITAMIN B-12 1000 MCG PO TABS: 1000.0000 ug | ORAL_TABLET | Freq: Every day | ORAL | Status: DC

## 2020-09-15 MED ORDER — MORPHINE SULFATE (PF) 2 MG/ML IV SOLN: 2.0000 mg | INTRAVENOUS | Status: DC | PRN

## 2020-09-15 MED ORDER — GLYCOPYRROLATE 0.2 MG/ML IJ SOLN
0.2000 mg | INTRAMUSCULAR | Status: DC | PRN
  Filled 2020-09-15: qty 1

## 2020-09-15 MED ORDER — POLYVINYL ALCOHOL 1.4 % OP SOLN
1.0000 [drp] | Freq: Four times a day (QID) | OPHTHALMIC | Status: DC | PRN
  Filled 2020-09-15: qty 15

## 2020-09-15 MED ORDER — DIPHENHYDRAMINE HCL 50 MG/ML IJ SOLN: 25.0000 mg | INTRAMUSCULAR | Status: DC | PRN

## 2020-09-15 MED ORDER — ZINC SULFATE 220 (50 ZN) MG PO CAPS: 220.0000 mg | ORAL_CAPSULE | Freq: Every day | ORAL | Status: DC

## 2020-09-15 MED ORDER — DEXTROSE 50 % IV SOLN
1.0000 | Freq: Once | INTRAVENOUS | Status: AC
  Administered 2020-09-15: 50 mL via INTRAVENOUS
  Filled 2020-09-15: qty 50

## 2020-09-15 MED ORDER — SODIUM BICARBONATE 8.4 % IV SOLN
50.0000 meq | Freq: Once | INTRAVENOUS | Status: AC
  Administered 2020-09-15: 50 meq via INTRAVENOUS
  Filled 2020-09-15: qty 50

## 2020-09-15 MED ORDER — LINAGLIPTIN 5 MG PO TABS: 5.0000 mg | ORAL_TABLET | Freq: Every day | ORAL | Status: DC

## 2020-09-15 MED ORDER — VITAMIN D (ERGOCALCIFEROL) 1.25 MG (50000 UNIT) PO CAPS: 50000.0000 [IU] | ORAL_CAPSULE | ORAL | Status: DC

## 2020-09-15 MED ORDER — ASPIRIN 81 MG PO CHEW
81.0000 mg | CHEWABLE_TABLET | Freq: Every day | ORAL | Status: DC
  Administered 2020-09-15: 81 mg

## 2020-09-15 MED ORDER — CALCIUM GLUCONATE-NACL 1-0.675 GM/50ML-% IV SOLN
1.0000 g | Freq: Once | INTRAVENOUS | Status: AC
  Administered 2020-09-15: 1000 mg via INTRAVENOUS
  Filled 2020-09-15: qty 50

## 2020-09-15 MED ORDER — GLYCOPYRROLATE 0.2 MG/ML IJ SOLN: 0.2000 mg | INTRAMUSCULAR | Status: DC | PRN

## 2020-09-15 MED ORDER — PATIROMER SORBITEX CALCIUM 8.4 G PO PACK
25.2000 g | PACK | Freq: Every day | ORAL | Status: AC
  Administered 2020-09-15: 25.2 g via ORAL
  Filled 2020-09-15: qty 3

## 2020-09-15 MED ORDER — ASCORBIC ACID 500 MG PO TABS: 500.0000 mg | ORAL_TABLET | Freq: Every day | ORAL | Status: DC

## 2020-09-15 MED ORDER — ONDANSETRON HCL 4 MG PO TABS: 4.0000 mg | ORAL_TABLET | Freq: Four times a day (QID) | ORAL | Status: DC | PRN

## 2020-10-06 NOTE — Progress Notes (Signed)
Nutrition Brief Note  Chart reviewed. Patient now transitioning to comfort care.   No nutrition interventions warranted at this time. Please consult RD as needed.   Raoul Ciano King, MS, RD, LDN Pager number available on Amion 

## 2020-10-06 NOTE — Progress Notes (Signed)
Late note 1715 Patient terminally extubated per family request. Patient died ar 63 with nurse in room and family outside of room.

## 2020-10-06 NOTE — Progress Notes (Signed)
ANTICOAGULATION CONSULT NOTE  Pharmacy Consult for Heparin Infusion  Indication: atrial fibrillation  Allergies  Allergen Reactions  . Flexeril [Cyclobenzaprine] Other (See Comments)    Reaction:  Pt gets jittery    Patient Measurements: Height: 6' 3.98" (193 cm) Weight: 119.7 kg (264 lb) IBW/kg (Calculated) : 86.76 Heparin Dosing Weight: 110 kg  Vital Signs: Temp: 97.2 F (36.2 C) (01/11 0400) Temp Source: Axillary (01/11 0400) BP: 90/55 (01/11 0600) Pulse Rate: 63 (01/11 0600)  Labs: Recent Labs    09/13/20 0723 09/13/20 0723 09/14/20 0222 09/14/20 1311 09/14/20 2356 09/07/2020 0340 09/10/2020 1000  HGB 16.3  --  16.7  --   --   --   --   HCT 48.0  --  48.7  --   --   --   --   PLT 130*  --  115*  --   --   --   --   LABPROT 22.0*  --   --   --   --   --   --   INR 2.0*  --   --   --   --   --   --   HEPARINUNFRC  --    < > 1.11* 0.90* 0.58  --  0.52  CREATININE 2.18*  --  3.33*  --   --  5.02* 5.56*  CKTOTAL  --   --   --   --   --   --  128   < > = values in this interval not displayed.    Estimated Creatinine Clearance: 16.7 mL/min (A) (by C-G formula based on SCr of 5.56 mg/dL (H)).   Medical History: Past Medical History:  Diagnosis Date  . Atrial fibrillation (HCC)   . Class 1 obesity   . Hypertension   . Osteoarthritis   . Peripheral neuropathy   . Spinal stenosis   . Type 2 diabetes mellitus (HCC) 09/27/20     Assessment: 75 yo male with PMH of HTN, DM, Peripheral neuropathy and OA. Patient admitted pneumonia due to COVID-19 virus. Patient was started on enoxaparin treatment dosing on 1/9 @ 0530 for new A. Fib w/RVR. [name] Scr has increased from 0.66 to 2.18. Due to acute renal failure decision was made to transition patient from therapeutic enoxaparin to heparin infusion.    01/10 0222 HL 1.11 01/10 1311 HL 0.90 01/10 2356 HL 0.58, therapeutic x 1  01/11 1000 HL 0.52, therapeutic x 2  Goal of Therapy:   Heparin level 0.3-0.7  units/ml Monitor platelets by anticoagulation protocol: Yes    Plan:  Continue heparin drip at 1100 units/hr. HL and CBC with morning labs.    Laureen Ochs, PharmD Clinical Pharmacist 09/24/2020 11:11 AM

## 2020-10-06 NOTE — Progress Notes (Signed)
[place] met family outside pt.'s rm. in ICU hallway; three family members requested prayer, said pt. is in critical condition --> CH prayed for divine peace and presence for pt. and family as they prepare to visit.  [place] remains available.

## 2020-10-06 NOTE — Progress Notes (Signed)
ANTICOAGULATION CONSULT NOTE  Pharmacy Consult for Heparin Infusion  Indication: atrial fibrillation  Allergies  Allergen Reactions  . Flexeril [Cyclobenzaprine] Other (See Comments)    Reaction:  Pt gets jittery    Patient Measurements: Height: 6' 3.98" (193 cm) Weight: 119.7 kg (264 lb) IBW/kg (Calculated) : 86.76 Heparin Dosing Weight: 110 kg  Vital Signs: Temp: 97.3 F (36.3 C) (01/11 0000) Temp Source: Axillary (01/11 0000) BP: 101/62 (01/11 0200) Pulse Rate: 55 (01/11 0200)  Labs: Recent Labs    09/12/20 0409 09/13/20 0723 09/14/20 0222 09/14/20 1311 09/14/20 2356  HGB 14.8 16.3 16.7  --   --   HCT 42.7 48.0 48.7  --   --   PLT 167 130* 115*  --   --   LABPROT  --  22.0*  --   --   --   INR  --  2.0*  --   --   --   HEPARINUNFRC  --   --  1.11* 0.90* 0.58  CREATININE 0.66 2.18* 3.33*  --   --     Estimated Creatinine Clearance: 27.9 mL/min (A) (by C-G formula based on SCr of 3.33 mg/dL (H)).   Medical History: Past Medical History:  Diagnosis Date  . Atrial fibrillation (HCC)   . Class 1 obesity   . Hypertension   . Osteoarthritis   . Peripheral neuropathy   . Spinal stenosis   . Type 2 diabetes mellitus (HCC) 2020-09-17     Assessment: 75 yo male with PMH of HTN, DM, Peripheral neuropathy and OA. Patient admitted pneumonia due to COVID-19 virus. Patient was started on enoxaparin treatment dosing on 1/9 @ 0530 for new A. Fib w/RVR. [name] Scr has increased from 0.66 to 2.18. Due to acute renal failure decision was made to transition patient from therapeutic enoxaparin to heparin infusion.    01/10 0222 HL 1.11 01/10 1311 HL 0.90 01/10 2356 HL 0.58 , therapeutic X 1   Goal of Therapy:   Heparin level 0.3-0.7 units/ml Monitor platelets by anticoagulation protocol: Yes    Plan:  1/10:  HL @ 2356 = 0.58, therapeutic X 1  Will draw confirmation level on 1/11 @ 0800.   09/17/2020 2:25 AM

## 2020-10-06 NOTE — Plan of Care (Signed)
Patient is extubated 

## 2020-10-06 NOTE — Progress Notes (Signed)
Inpatient Diabetes Program Recommendations  AACE/ADA: New Consensus Statement on Inpatient Glycemic Control   Target Ranges:  Prepandial:   less than 140 mg/dL      Peak postprandial:   less than 180 mg/dL (1-2 hours)      Critically ill patients:  140 - 180 mg/dL    Review of Glycemic Control  Current orders for Inpatient glycemic control: Levemir 26 units BID, Novolog 0-20 units Q4H, Tradjenta 5 mg daily; Decadron 6 mg Q24H  Inpatient Diabetes Program Recommendations:    Insulin: If steroids are continued, please consider increasing Levemir to 32 units BID.  Thanks, Orlando Penner, RN, MSN, CDE Diabetes Coordinator Inpatient Diabetes Program [phone] (Team Pager from 8am to 5pm)

## 2020-10-06 NOTE — Progress Notes (Signed)
GOALS OF CARE DISCUSSION  The Clinical status was relayed to family in detail. Daughter Juliette Alcide  Updated and notified of patients medical condition.  Patient remains unresponsive and will not open eyes to command.   Upon assessment his breath sounds are course crackles with significant secretions to oral pharyngeal region.   Patient is having a weak cough and struggling to remove secretions.   patient with increased WOB and using accessory muscles to breathe Explained to family course of therapy and the modalities     Patient with Progressive multiorgan failure with very low chance of meaningful recovery despite all aggressive and optimal medical therapy. Patient is in the Dying  Process associated with Suffering.  Family understands the situation.  They have consented and agreed to DNR End Of Life Visitation Policy implemented  Family are satisfied with Plan of action and management. All questions answered  Additional CC time 32 mins   Clair Bardwell Santiago Glad, M.D.  [facility]  Medical Director [facility] Medical Director [facility] Cardio-Pulmonary Department

## 2020-10-06 NOTE — Death Summary Note (Addendum)
DEATH SUMMARY   Patient Details  Name: Steven Gallegos MRN: [id number] DOB: 02/19/1946  Admission/Discharge Information   Admit Date:  10/09/2020  Date of Death:   2020-10-14   Time of Death:  01/19/20  Length of Stay: 4  Referring Physician: Cleta Alberts, MD   Reason(s) for Hospitalization  COVID 19 pneumonia  sepsis from covid present on admission    Diagnoses  Preliminary cause of death: COVID 19 pneumonia, ISCHEMIC CARDIOMYOPATHY Secondary Diagnoses (including complications and co-morbidities):  Principal Problem:   Pneumonia due to COVID-19 virus Active Problems:   Type 2 diabetes mellitus (HCC)   Hypertension   Class 1 obesity   Peripheral neuropathy   Protein-calorie malnutrition, severe (HCC)   Acute respiratory failure with hypoxia [facility])   Brief Hospital Course (including significant findings, care, treatment, and services provided and events leading to death)   27 yod malewithCOVID-19 Pneumoniaadmitted to PCU initially on HHFNC,ultimately requiring BiPAP due to acute hypoxic respiratory failure and increased work of breathing with admission to ICU.  Significant Hospital Events:  2020-10-09-admit to [facility] serviceon HFNC,Covid positive 09/12/2020-transferred to PCUon HHFNC 09/13/2020-transferred to ICU,as stepdown patient on BiPAP  1/10 patient on biPAP, emergently intubated  14-Oct-2022 severe ARDS  GOALS OF CARE DISCUSSION  The Clinical status was relayed to family in detail.  Updated and notified of patients medical condition.  Patient remains unresponsive and will not open eyes to command.   Upon assessment his breath sounds are course crackles with significant secretions to oral pharyngeal region.   Patient is having a weak cough and struggling to remove secretions.   patient with increased WOB and using accessory muscles to breathe Explained to family course of therapy and the modalities     Patient with Progressive multiorgan failure with very  low chance of meaningful recovery despite all aggressive and optimal medical therapy. Patient is in the Dying  Process associated with Suffering.  Family understands the situation.  They have consented and agreed to DNR/DNI and would like to proceed with Comfort care measures.  Family are satisfied with Plan of action and management. All questions answered  Patient died 17:21      Pertinent Labs and Studies  Significant Diagnostic Studies DG Chest 1 View  Result Date: 09/13/2020 CLINICAL DATA:  Shortness of breath, history of diabetes and hypertension EXAM: CHEST  1 VIEW COMPARISON:  Radiograph 10/09/2020 FINDINGS: Increasing heterogeneous opacities throughout both lungs with slightly diminished volumes from comparison exam. No discernible pneumothorax or effusion. Stable cardiomediastinal contours accounting for differences in technique. No acute osseous or soft tissue abnormality. Telemetry leads overlie the chest. IMPRESSION: Increasing heterogeneous opacities throughout both lungs, could reflect worsening infection or edema though may be somewhat accentuated by diminished lung volumes. Electronically Signed   By: Kreg Shropshire M.D.   On: 09/13/2020 05:52   DG Abd 1 View  Result Date: 09/14/2020 CLINICAL DATA:  Confirm placement of nasogastric tube. EXAM: ABDOMEN - 1 VIEW COMPARISON:  None. FINDINGS: The bowel gas pattern is normal. Nasogastric tube with tip and side port overlying the stomach. Heterogeneous opacification lung bases, similar to same day chest radiograph. Linear radiopaque density overlying the right lower quadrant measuring 4.5 cm. IMPRESSION: Nasogastric tube with tip and side port overlying the stomach. Linear radiopaque density overlying the right lower quadrant measuring 4.5 cm which may be external to the patient, recommend correlation with direct visualization Electronically Signed   By: Maudry Mayhew MD   On: 09/14/2020 18:57   DG CHEST PORT  1 VIEW  Result Date:  09/14/2020 CLINICAL DATA:  COVID pneumonia assess central line and endotracheal tube placement. EXAM: PORTABLE CHEST 1 VIEW COMPARISON:  Chest radiograph September 13, 2020. FINDINGS: Right IJ central venous catheter with tip overlying the superior cavoatrial junction. Endotracheal tube with tip overlying the midthoracic trachea. Nasogastric tube courses below diaphragm with tip obscured by collimation. The cardiomediastinal silhouette appears unchanged. Increased heterogeneous opacification in the bilateral lungs with slightly increased lung volumes. IMPRESSION: 1. Worsening heterogeneous opacification of the bilateral lungs. 2. Endotracheal tube with tip overlying the midthoracic trachea. 3. Right IJ central venous catheter with tip overlying the superior cavoatrial junction. Electronically Signed   By: Maudry MayhewJeffrey  Waltz MD   On: 09/14/2020 19:00   DG Chest Port 1 View  Result Date: 09/18/2020 CLINICAL DATA:  Feeling bad for 1 week. EXAM: PORTABLE CHEST 1 VIEW COMPARISON:  Jan 19, 2017 FINDINGS: Moderate severity bilateral multifocal infiltrates are seen. This is most prominent within the bilateral lung bases. There is no evidence of a pleural effusion or pneumothorax. The heart size and mediastinal contours are within normal limits. The visualized skeletal structures are unremarkable. IMPRESSION: Moderate severity bilateral multifocal infiltrates, most prominent within the bilateral lung bases. Electronically Signed   By: Aram Candelahaddeus  Houston M.D.   On: March 09, 2021 18:27    Microbiology Recent Results (from the past 240 hour(s))  Blood Culture (routine x 2)     Status: Abnormal   Collection Time: July 11, 2021  5:57 PM   Specimen: BLOOD  Result Value Ref Range Status   Specimen Description   Final    BLOOD LEFT ANTECUBITAL Performed at [facility], [address] [place], [place] [address]    Special Requests   Final    BOTTLES DRAWN AEROBIC AND ANAEROBIC Blood Culture adequate volume Performed at  [facility], [address] [place], [place] [address]    Culture  Setup Time   Final    GRAM POSITIVE COCCI AEROBIC BOTTLE ONLY CRITICAL RESULT CALLED TO, READ BACK BY AND VERIFIED WITH: AMY THOMPSON AT 1300 09/11/20.PMF    Culture (A)  Final    STAPHYLOCOCCUS SPECIES (COAGULASE NEGATIVE) PROBABLY STAPH SIMULANS THE SIGNIFICANCE OF ISOLATING THIS ORGANISM FROM A SINGLE SET OF BLOOD CULTURES WHEN MULTIPLE SETS ARE DRAWN IS UNCERTAIN. PLEASE NOTIFY THE MICROBIOLOGY DEPARTMENT WITHIN ONE WEEK IF SPECIATION AND SENSITIVITIES ARE REQUIRED. Performed at [facility], [address] [place], [place] [address]    Report Status 10/04/2020 FINAL  Final  Blood Culture ID Panel (Reflexed)     Status: Abnormal   Collection Time: July 11, 2021  5:57 PM  Result Value Ref Range Status   Enterococcus faecalis NOT DETECTED NOT DETECTED Final   Enterococcus Faecium NOT DETECTED NOT DETECTED Final   Listeria monocytogenes NOT DETECTED NOT DETECTED Final   Staphylococcus species DETECTED (A) NOT DETECTED Final    Comment: CRITICAL RESULT CALLED TO, READ BACK BY AND VERIFIED WITH: AMY THOMPSON AT 1300 09/11/20.PMF    Staphylococcus aureus (BCID) NOT DETECTED NOT DETECTED Final   Staphylococcus epidermidis NOT DETECTED NOT DETECTED Final   Staphylococcus lugdunensis NOT DETECTED NOT DETECTED Final   Streptococcus species NOT DETECTED NOT DETECTED Final   Streptococcus agalactiae NOT DETECTED NOT DETECTED Final   Streptococcus pneumoniae NOT DETECTED NOT DETECTED Final   Streptococcus pyogenes NOT DETECTED NOT DETECTED Final   A.calcoaceticus-baumannii NOT DETECTED NOT DETECTED Final   Bacteroides fragilis NOT DETECTED NOT DETECTED Final   Enterobacterales NOT DETECTED NOT DETECTED Final  Enterobacter cloacae complex NOT DETECTED NOT DETECTED Final   Escherichia coli NOT DETECTED NOT DETECTED Final   Klebsiella aerogenes NOT DETECTED NOT DETECTED Final   Klebsiella oxytoca NOT DETECTED NOT  DETECTED Final   Klebsiella pneumoniae NOT DETECTED NOT DETECTED Final   Proteus species NOT DETECTED NOT DETECTED Final   Salmonella species NOT DETECTED NOT DETECTED Final   Serratia marcescens NOT DETECTED NOT DETECTED Final   Haemophilus influenzae NOT DETECTED NOT DETECTED Final   Neisseria meningitidis NOT DETECTED NOT DETECTED Final   Pseudomonas aeruginosa NOT DETECTED NOT DETECTED Final   Stenotrophomonas maltophilia NOT DETECTED NOT DETECTED Final   Candida albicans NOT DETECTED NOT DETECTED Final   Candida auris NOT DETECTED NOT DETECTED Final   Candida glabrata NOT DETECTED NOT DETECTED Final   Candida krusei NOT DETECTED NOT DETECTED Final   Candida parapsilosis NOT DETECTED NOT DETECTED Final   Candida tropicalis NOT DETECTED NOT DETECTED Final   Cryptococcus neoformans/gattii NOT DETECTED NOT DETECTED Final    Comment: Performed at [facility] [address] [place], [place] [address]  Blood Culture (routine x 2)     Status: None   Collection Time: 09/08/2020  5:58 PM   Specimen: BLOOD  Result Value Ref Range Status   Specimen Description BLOOD BLOOD LEFT HAND  Final   Special Requests   Final    BOTTLES DRAWN AEROBIC AND ANAEROBIC Blood Culture adequate volume   Culture   Final    NO GROWTH 5 DAYS Performed at [facility] [address] [place], [place] [address]    Report Status 09-16-2020 FINAL  Final  Urine culture     Status: Abnormal   Collection Time: 09/23/2020  7:25 PM   Specimen: Urine, Random  Result Value Ref Range Status   Specimen Description   Final    URINE, RANDOM Performed at [facility], [address] [place], [place] [address]    Special Requests   Final    NONE Performed at [facility], [address] [place], [place] [address]    Culture MULTIPLE SPECIES PRESENT, SUGGEST RECOLLECTION (A)  Final   Report Status 09/12/2020 FINAL  Final    Lab Basic Metabolic Panel: Recent Labs  Lab  09/11/20 0422 09/12/20 0409 09/13/20 0723 09/14/20 0222 09/16/2020 0340 2020/09/16 1000  NA 145 144 140 145 145 145  K 3.9 4.0 4.7 4.3 6.4* 6.5*  CL 113* 110 107 107 107 105  CO2 23 23 18* 22 21* 22  GLUCOSE 197* 328* 365* 149* 245* 231*  BUN 25* 28* 55* 75* 97* 98*  CREATININE 0.54* 0.66 2.18* 3.33* 5.02* 5.56*  CALCIUM 8.4* 8.4* 8.5* 8.3* 7.5* 7.4*  MG 2.4 2.2 2.1 2.2 2.3  --   PHOS 2.3* 3.9 6.9* 6.5* 12.7*  --    Liver Function Tests: Recent Labs  Lab 09/11/20 0422 09/12/20 0409 09/13/20 0723 09/14/20 0222 2020/09/16 0340  AST 47* 41 79* 71* 32  ALT 33 30 33 29 29  ALKPHOS 90 119 260* 301* 260*  BILITOT 1.0 0.8 2.0* 1.1 0.9  PROT 6.3* 5.9* 6.0* 5.8* 5.7*  ALBUMIN 2.5* 2.4* 2.8* 2.7* 2.6*   No results for input(s): LIPASE, AMYLASE in the last 168 hours. No results for input(s): AMMONIA in the last 168 hours. CBC: Recent Labs  Lab 09/11/20 0422 09/12/20 0409 09/13/20 0723 09/14/20 0222 09/16/20 1330  WBC 6.3 7.3 16.4* 13.1* 16.5*  NEUTROABS 5.3 6.1 14.2* 11.6* 14.8*  HGB 15.1  14.8 16.3 16.7 14.6  HCT 43.6 42.7 48.0 48.7 46.4  MCV 85.0 85.2 86.8 85.7 92.2  PLT 223 167 130* 115* 153   Cardiac Enzymes: Recent Labs  Lab September 30, 2020 1000  CKTOTAL 128   Sepsis Labs: Recent Labs  Lab 09/25/2020 1751 09/20/2020 1752 09/29/2020 2038 09/29/2020 2146 09/08/2020 2218 09/11/20 0422 09/12/20 0409 09/13/20 0723 09/14/20 0222 Sep 30, 2020 1330  PROCALCITON  --   --   --  <0.10  --   --   --  0.45 0.78  --   WBC  --    < >  --   --   --  6.3 7.3 16.4* 13.1* 16.5*  LATICACIDVEN 2.9*  --  6.4*  --  4.2* 1.9  --   --   --   --    < > = values in this interval not displayed.    Erin Fulling 2020-09-30, 5:27 PM

## 2020-10-06 NOTE — Progress Notes (Signed)
Overnight, Dilt gtt stopped due to HR < 60. Continued diminished UO, NP aware, 500 LR bolus w/o positive effects. Cr and K elevated this AM, 1 amp Bicarb, 1 amp d50, 10u IV insulin, and Ca gluconate IV given. Prop @ 10 and Fent @ 20.

## 2020-10-06 NOTE — Progress Notes (Signed)
Chart reviewed.   Patient was on hospitalist service as of 09/14/20.   Intubated on 1/10 at 1646.    ICU team to assume primary care.   [facility] hospitalist service to sign off at this time.   Please contact flow manager for transfer back to [facility] once stabilized for transfer to floor.  Lolita Patella MD

## 2020-10-06 NOTE — Consult Note (Signed)
[address] [place], [place] [address] Phone [phone]. Fax [phone]  Date: 2020-09-25                  Patient Name:  Steven Gallegos  MRN: [id number]  DOB: April 15, 1946  Age / Sex: 75 y.o., male         PCP: Cleta Alberts, MD                 Service Requesting Consult: IM/ Tresa Moore, MD                 Reason for Consult: ARF            History of Present Illness: Patient is a 75 y.o. male  admitted to [facility] on 09/08/2020 for Acute respiratory failure with hypoxia (HCC) [J96.01] Pneumonia due to COVID-19 virus [U07.1, J12.82] COVID-19 [U07.1] .  Patient initially presented to the emergency room on January 6 for flulike symptoms which developed a week prior to admission.  He also complained of severe shortness of breath and weakness to the point that he could not walk.  He was diagnosed with sepsis and admitted for further evaluation. Diagnosed with COVID infection.  [employer] vaccine in September. Baseline creatinine of 0.66 on January 8.  Presenting creatinine of 2.18 which has worsened to 5.02 today along with hyperkalemia with potassium of 6.4.  Nephrology consult now requested for evaluation.   Medications: Outpatient medications: Medications Prior to Admission  Medication Sig Dispense Refill Last Dose  . Ergocalciferol (VITAMIN D2) 2000 UNITS TABS Take 1 tablet by mouth daily.     Marland Kitchen gabapentin (NEURONTIN) 300 MG capsule Take 900 mg by mouth at bedtime.     . insulin detemir (LEVEMIR) 100 UNIT/ML injection Inject 25 Units into the skin at bedtime.     Marland Kitchen albuterol (PROVENTIL HFA;VENTOLIN HFA) 108 (90 Base) MCG/ACT inhaler Inhale 2 puffs into the lungs every 6 (six) hours as needed for wheezing or shortness of breath. 1 Inhaler 0   . aspirin EC 81 MG EC tablet Take 1 tablet (81 mg total) by mouth daily. 30 tablet 0 unknown at unknown  . cyanocobalamin 500 MCG tablet Take 1,000 mcg by mouth daily.     . insulin aspart protamine- aspart  (NOVOLOG MIX 70/30) (70-30) 100 UNIT/ML injection Inject 45-60 Units into the skin 2 (two) times daily with a meal. 60 units Am, 45 units PM     . Multiple Vitamins-Minerals (ONE-A-DAY MENS HEALTH FORMULA PO) Take 1 tablet by mouth daily.     Marland Kitchen omeprazole (PRILOSEC) 20 MG capsule Take 20 mg by mouth daily.       Current medications: Current Facility-Administered Medications  Medication Dose Route Frequency Provider Last Rate Last Admin  . acetaminophen (TYLENOL) tablet 650 mg  650 mg Oral Q6H PRN Bobette Mo, MD   650 mg at 09/12/20 1850   Or  . acetaminophen (TYLENOL) suppository 650 mg  650 mg Rectal Q6H PRN Bobette Mo, MD   650 mg at 09/13/20 0042  . albuterol (VENTOLIN HFA) 108 (90 Base) MCG/ACT inhaler 2 puff  2 puff Inhalation Q6H Bobette Mo, MD   2 puff at 09/13/20 0248  . ascorbic acid (VITAMIN C) tablet 500 mg  500 mg Oral Daily Bobette Mo, MD   500 mg at 09/14/20 4268  . aspirin EC tablet 81 mg  81 mg Oral Daily Bobette Mo, MD   81 mg at 09/14/20 3419  .  chlorhexidine (PERIDEX) 0.12 % solution 15 mL  15 mL Mouth Rinse BID Erin Fulling, MD   15 mL at 09/14/20 2245  . Chlorhexidine Gluconate Cloth 2 % PADS 6 each  6 each Topical Daily Rust-Chester, Cecelia Byars, NP   6 each at 09/14/20 0935  . chlorpheniramine-HYDROcodone (TUSSIONEX) 10-8 MG/5ML suspension 5 mL  5 mL Oral Q12H PRN Bobette Mo, MD   5 mL at 09/14/20 2585  . dexamethasone (DECADRON) injection 6 mg  6 mg Intravenous Q24H Bobette Mo, MD   6 mg at 09/14/20 2200  . diltiazem (CARDIZEM) 125 mg in dextrose 5% 125 mL (1 mg/mL) infusion  5-15 mg/hr Intravenous Titrated Rust-Chester, Cecelia Byars, NP   Stopped at 09/09/2020 0242  . fentaNYL in NS (60mcg/ml) infusion-PREMIX  0-400 mcg/hr Intravenous Continuous Erin Fulling, MD 2 mL/hr at 09/14/20 2250 20 mcg/hr at 09/14/20 2250  . gabapentin (NEURONTIN) capsule 900 mg  900 mg Oral QHS Bobette Mo, MD   900 mg  at 09/14/20 2245  . guaiFENesin-dextromethorphan (ROBITUSSIN DM) 100-10 MG/5ML syrup 10 mL  10 mL Oral Q4H PRN Bobette Mo, MD      . heparin ADULT infusion 100 units/mL (25000 units/239mL)  1,100 Units/hr Intravenous Continuous Gillis Santa, MD 11 mL/hr at 09/12/2020 0147 1,100 Units/hr at 09/28/2020 0147  . insulin aspart (novoLOG) injection 0-20 Units  0-20 Units Subcutaneous Q4H Rust-Chester, Britton L, NP   7 Units at 09/06/2020 0500  . insulin detemir (LEVEMIR) injection 26 Units  26 Units Subcutaneous BID Gillis Santa, MD   26 Units at 09/14/20 2245  . linagliptin (TRADJENTA) tablet 5 mg  5 mg Oral Daily Bobette Mo, MD   5 mg at 09/14/20 2778  . MEDLINE mouth rinse  15 mL Mouth Rinse q12n4p Erin Fulling, MD   15 mL at 09/14/20 1807  . melatonin tablet 3 mg  3 mg Oral QHS Rust-Chester, Britton L, NP      . morphine 2 MG/ML injection 2 mg  2 mg Intravenous Q2H PRN Rust-Chester, Britton L, NP   2 mg at 09/14/20 0943  . ondansetron (ZOFRAN) tablet 4 mg  4 mg Oral Q6H PRN Bobette Mo, MD       Or  . ondansetron [facility]) injection 4 mg  4 mg Intravenous Q6H PRN Bobette Mo, MD      . pantoprazole (PROTONIX) injection 40 mg  40 mg Intravenous Q24H Rust-Chester, Britton L, NP   40 mg at 09/14/20 0921  . patiromer Lelon Perla) packet 25.2 g  25.2 g Oral Daily Harlon Ditty D, NP      . propofol (DIPRIVAN) 1000 MG/100ML infusion  5-80 mcg/kg/min Intravenous Titrated Erin Fulling, MD 7.19 mL/hr at 09/14/2020 0141 10 mcg/kg/min at 09/12/2020 0141  . vitamin B-12 (CYANOCOBALAMIN) tablet 1,000 mcg  1,000 mcg Oral Daily Bobette Mo, MD   1,000 mcg at 09/14/20 2423  . Vitamin D (Ergocalciferol) (DRISDOL) capsule 50,000 Units  50,000 Units Oral Q7 days Bobette Mo, MD   50,000 Units at 09/11/20 [phone]  . zinc sulfate capsule 220 mg  220 mg Oral Daily Bobette Mo, MD   220 mg at 09/14/20 1000      Allergies: Allergies  Allergen Reactions  . Flexeril  [Cyclobenzaprine] Other (See Comments)    Reaction:  Pt gets jittery      Past Medical History: Past Medical History:  Diagnosis Date  . Atrial fibrillation (HCC)   . Class 1  obesity   . Hypertension   . Osteoarthritis   . Peripheral neuropathy   . Spinal stenosis   . Type 2 diabetes mellitus (HCC) 10/05/2020     Past Surgical History: Past Surgical History:  Procedure Laterality Date  . BACK SURGERY       Family History: Family History  Problem Relation Age of Onset  . Diabetes Mellitus II Brother      Social History: Social History   Socioeconomic History  . Marital status: Single    Spouse name: Not on file  . Number of children: Not on file  . Years of education: Not on file  . Highest education level: Not on file  Occupational History  . Not on file  Tobacco Use  . Smoking status: Former Games developermoker  . Smokeless tobacco: Never Used  Substance and Sexual Activity  . Alcohol use: No  . Drug use: No  . Sexual activity: Not on file  Other Topics Concern  . Not on file  Social History Narrative  . Not on file   Social Determinants of Health   Financial Resource Strain: Not on file  Food Insecurity: Not on file  Transportation Needs: Not on file  Physical Activity: Not on file  Stress: Not on file  Social Connections: Not on file  Intimate Partner Violence: Not on file     Review of Systems: not available as patient is intubated and sedated Gen:  HEENT:  CV:  Resp:  GI: GU :  MS:  Derm:    Psych: Heme:  Neuro:  Endocrine  Vital Signs: Blood pressure (!) 90/55, pulse 63, temperature (!) 97.2 F (36.2 C), temperature source Axillary, resp. rate (!) 29, height 6' 3.98" (1.93 m), weight 119.7 kg, SpO2 90 %.   Intake/Output Summary (Last 24 hours) at 07/03/2021 0830 Last data filed at 07/03/2021 0600 Gross per 24 hour  Intake 70.16 ml  Output 590 ml  Net -519.84 ml    Weight trends: [employer]   09/07/2020 1751  Weight: 119.7 kg     Physical Exam: General:  critically ill  HEENT ETT in place  Lungs: Vent assisted  Heart::  irregular  Abdomen: soft  Extremities:  Trace edema  Neurologic: sedated  Skin: warm  Access: To be placed  Foley: present       Lab results: Basic Metabolic Panel: Recent Labs  Lab 09/13/20 0723 09/14/20 0222 06/07/21 0340  NA 140 145 145  K 4.7 4.3 6.4*  CL 107 107 107  CO2 18* 22 21*  GLUCOSE 365* 149* 245*  BUN 55* 75* 97*  CREATININE 2.18* 3.33* 5.02*  CALCIUM 8.5* 8.3* 7.5*  MG 2.1 2.2 2.3  PHOS 6.9* 6.5* 12.7*    Liver Function Tests: Recent Labs  Lab 06/07/21 0340  AST 32  ALT 29  ALKPHOS 260*  BILITOT 0.9  PROT 5.7*  ALBUMIN 2.6*   No results for input(s): LIPASE, AMYLASE in the last 168 hours. No results for input(s): AMMONIA in the last 168 hours.  CBC: Recent Labs  Lab 09/13/20 0723 09/14/20 0222  WBC 16.4* 13.1*  NEUTROABS 14.2* 11.6*  HGB 16.3 16.7  HCT 48.0 48.7  MCV 86.8 85.7  PLT 130* 115*    Cardiac Enzymes: No results for input(s): CKTOTAL, TROPONINI in the last 168 hours.  BNP: Invalid input(s): POCBNP  CBG: Recent Labs  Lab 09/14/20 1209 09/14/20 1755 09/14/20 1950 06/07/21 0022 06/07/21 0341  GLUCAP 198* 165* 196* 215* 223*  Microbiology: Recent Results (from the past 720 hour(s))  Blood Culture (routine x 2)     Status: None (Preliminary result)   Collection Time: 10/05/2020  5:57 PM   Specimen: BLOOD  Result Value Ref Range Status   Specimen Description BLOOD LEFT ANTECUBITAL  Final   Special Requests   Final    BOTTLES DRAWN AEROBIC AND ANAEROBIC Blood Culture adequate volume   Culture  Setup Time   Final    Organism ID to follow GRAM POSITIVE COCCI AEROBIC BOTTLE ONLY CRITICAL RESULT CALLED TO, READ BACK BY AND VERIFIED WITH: AMY THOMPSON AT 1300 09/11/20.PMF Performed at [facility], [address] [place], [place] [address]    Culture [facility] POSITIVE COCCI  Final   Report Status PENDING   Incomplete  Blood Culture ID Panel (Reflexed)     Status: Abnormal   Collection Time: 09/11/2020  5:57 PM  Result Value Ref Range Status   Enterococcus faecalis NOT DETECTED NOT DETECTED Final   Enterococcus Faecium NOT DETECTED NOT DETECTED Final   Listeria monocytogenes NOT DETECTED NOT DETECTED Final   Staphylococcus species DETECTED (A) NOT DETECTED Final    Comment: CRITICAL RESULT CALLED TO, READ BACK BY AND VERIFIED WITH: AMY THOMPSON AT 1300 09/11/20.PMF    Staphylococcus aureus (BCID) NOT DETECTED NOT DETECTED Final   Staphylococcus epidermidis NOT DETECTED NOT DETECTED Final   Staphylococcus lugdunensis NOT DETECTED NOT DETECTED Final   Streptococcus species NOT DETECTED NOT DETECTED Final   Streptococcus agalactiae NOT DETECTED NOT DETECTED Final   Streptococcus pneumoniae NOT DETECTED NOT DETECTED Final   Streptococcus pyogenes NOT DETECTED NOT DETECTED Final   A.calcoaceticus-baumannii NOT DETECTED NOT DETECTED Final   Bacteroides fragilis NOT DETECTED NOT DETECTED Final   Enterobacterales NOT DETECTED NOT DETECTED Final   Enterobacter cloacae complex NOT DETECTED NOT DETECTED Final   Escherichia coli NOT DETECTED NOT DETECTED Final   Klebsiella aerogenes NOT DETECTED NOT DETECTED Final   Klebsiella oxytoca NOT DETECTED NOT DETECTED Final   Klebsiella pneumoniae NOT DETECTED NOT DETECTED Final   Proteus species NOT DETECTED NOT DETECTED Final   Salmonella species NOT DETECTED NOT DETECTED Final   Serratia marcescens NOT DETECTED NOT DETECTED Final   Haemophilus influenzae NOT DETECTED NOT DETECTED Final   Neisseria meningitidis NOT DETECTED NOT DETECTED Final   Pseudomonas aeruginosa NOT DETECTED NOT DETECTED Final   Stenotrophomonas maltophilia NOT DETECTED NOT DETECTED Final   Candida albicans NOT DETECTED NOT DETECTED Final   Candida auris NOT DETECTED NOT DETECTED Final   Candida glabrata NOT DETECTED NOT DETECTED Final   Candida krusei NOT DETECTED NOT DETECTED Final    Candida parapsilosis NOT DETECTED NOT DETECTED Final   Candida tropicalis NOT DETECTED NOT DETECTED Final   Cryptococcus neoformans/gattii NOT DETECTED NOT DETECTED Final    Comment: Performed at [facility], [address] [place], [place] [address]  Blood Culture (routine x 2)     Status: None   Collection Time: 10/01/2020  5:58 PM   Specimen: BLOOD  Result Value Ref Range Status   Specimen Description BLOOD BLOOD LEFT HAND  Final   Special Requests   Final    BOTTLES DRAWN AEROBIC AND ANAEROBIC Blood Culture adequate volume   Culture   Final    NO GROWTH 5 DAYS Performed at [facility], [address], [place], [place] [address]    Report Status September 29, 2020 FINAL  Final  Urine culture     Status: Abnormal   Collection Time:  09/06/2020  7:25 PM   Specimen: Urine, Random  Result Value Ref Range Status   Specimen Description   Final    URINE, RANDOM Performed at [facility], [address] [place], [place] [address]    Special Requests   Final    NONE Performed at [facility], [address] [place], [place] [address]    Culture MULTIPLE SPECIES PRESENT, SUGGEST RECOLLECTION (A)  Final   Report Status 09/12/2020 FINAL  Final     Coagulation Studies: Recent Labs    09/13/20 0723  LABPROT 22.0*  INR 2.0*    Urinalysis: No results for input(s): COLORURINE, LABSPEC, PHURINE, GLUCOSEU, HGBUR, BILIRUBINUR, KETONESUR, PROTEINUR, UROBILINOGEN, NITRITE, LEUKOCYTESUR in the last 72 hours.  Invalid input(s): APPERANCEUR      Imaging: DG Abd 1 View  Result Date: 09/14/2020 CLINICAL DATA:  Confirm placement of nasogastric tube. EXAM: ABDOMEN - 1 VIEW COMPARISON:  None. FINDINGS: The bowel gas pattern is normal. Nasogastric tube with tip and side port overlying the stomach. Heterogeneous opacification lung bases, similar to same day chest radiograph. Linear radiopaque density overlying the right lower quadrant measuring 4.5 cm.  IMPRESSION: Nasogastric tube with tip and side port overlying the stomach. Linear radiopaque density overlying the right lower quadrant measuring 4.5 cm which may be external to the patient, recommend correlation with direct visualization Electronically Signed   By: Maudry MayhewJeffrey  Waltz MD   On: 09/14/2020 18:57   DG CHEST PORT 1 VIEW  Result Date: 09/14/2020 CLINICAL DATA:  COVID pneumonia assess central line and endotracheal tube placement. EXAM: PORTABLE CHEST 1 VIEW COMPARISON:  Chest radiograph September 13, 2020. FINDINGS: Right IJ central venous catheter with tip overlying the superior cavoatrial junction. Endotracheal tube with tip overlying the midthoracic trachea. Nasogastric tube courses below diaphragm with tip obscured by collimation. The cardiomediastinal silhouette appears unchanged. Increased heterogeneous opacification in the bilateral lungs with slightly increased lung volumes. IMPRESSION: 1. Worsening heterogeneous opacification of the bilateral lungs. 2. Endotracheal tube with tip overlying the midthoracic trachea. 3. Right IJ central venous catheter with tip overlying the superior cavoatrial junction. Electronically Signed   By: Maudry MayhewJeffrey  Waltz MD   On: 09/14/2020 19:00      Assessment & Plan: Pt is a 75 y.o.   male with obesity, hypertension, osteoarthritis, diabetes with peripheral neuropathy, spinal stenosis, was admitted on 09/25/2020 with Acute respiratory failure with hypoxia (HCC) [J96.01] Pneumonia due to COVID-19 virus [U07.1, J12.82] COVID-19 [U07.1]   #Acute kidney injury. Baseline creatinine of 0.66 from September 12, 2020 Urinalysis January 6: Greater than 500 glucose, ketones positive, protein 100 mg/dL, 0-5 RBCs, 0-5 WBCs Imaging: No renal imaging available  Creatinine trends as follows Lab Results  Component Value Date   CREATININE 5.02 (H) 2021-08-01   CREATININE 3.33 (H) 09/14/2020   CREATININE 2.18 (H) 09/13/2020  Severe AKI due to ATN from sepsis Patient is  oliguric   #Severe hyperkalemia Lab Results  Component Value Date   K 6.4 (HH) 2021-08-01   #Diabetes type 2, uncontrolled Lab Results  Component Value Date   HGBA1C 12.2 (H) 09/13/2020   HGBA1C 12.6 (H) 09/13/2020    #COVID-19 pneumonia Test + September 10, 2020   Plan: Suspect rhabdomyolysis as Phos and K severely elevated Check CK If family desires agressive care, recommend IHD Will discuss with ICU team to obtain dialysis access    LOS: 4 Paulo Keimig 2022-08-318:30 AM    Note: This note was prepared with Dragon dictation. Any transcription errors are  unintentional

## 2020-10-06 NOTE — Progress Notes (Signed)
CRITICAL VALUE ALERT  Critical Value:  K 6.4   Date & Time Notied:  09/12/2020 @ 0442  Provider Notified: Leanord Asal, NP  Orders Received/Actions taken: Provider will review, no orders at this time.

## 2020-10-06 NOTE — Progress Notes (Signed)
CRITICAL CARE NOTE 74 yod malewithCOVID-19 Pneumoniaadmitted to PCU initially on HHFNC,ultimately requiring BiPAP due to acute hypoxic respiratory failure and increased work of breathing with admission to ICU.  Significant Hospital Events:  09/07/2020-admit to TRH serviceon HFNC,Covid positive 09/12/2020-transferred to PCUon HHFNC 09/13/2020-transferred to ICU,as stepdown patient on BiPAP  1/10 patient on biPAP, emergently intubated  1/11 severe ARDS     CC  follow up respiratory failure  SUBJECTIVE Patient remains critically ill Prognosis is guarded Progressive multiorgan failure  Vent Mode: PRVC FiO2 (%):  [40 %-100 %] 70 % Set Rate:  [20 bmp-28 bmp] 28 bmp Vt Set:  [500 mL] 500 mL PEEP:  [5 cmH20-15 cmH20] 5 cmH20   CBC    Component Value Date/Time   WBC 13.1 (H) 09/14/2020 0222   RBC 5.68 09/14/2020 0222   HGB 16.7 09/14/2020 0222   HGB 18.3 (H) 01/03/2015 1005   HCT 48.7 09/14/2020 0222   HCT 57.7 (H) 01/03/2015 1005   PLT 115 (L) 09/14/2020 0222   PLT 249 01/03/2015 1005   MCV 85.7 09/14/2020 0222   MCV 93 01/03/2015 1005   MCH 29.4 09/14/2020 0222   MCHC 34.3 09/14/2020 0222   RDW 13.2 09/14/2020 0222   RDW 14.0 01/03/2015 1005   LYMPHSABS 1.0 09/14/2020 0222   LYMPHSABS 1.4 01/03/2015 1005   MONOABS 0.3 09/14/2020 0222   MONOABS 1.3 (H) 01/03/2015 1005   EOSABS 0.1 09/14/2020 0222   EOSABS 0.0 01/03/2015 1005   BASOSABS 0.0 09/14/2020 0222   BASOSABS 0.1 01/03/2015 1005   BMP Latest Ref Rng & Units 2020/10/13 09/14/2020 09/13/2020  Glucose 70 - 99 mg/dL 967(E) 938(B) 017(P)  BUN 8 - 23 mg/dL 10(C) 58(N) 27(P)  Creatinine 0.61 - 1.24 mg/dL 8.24(M) 3.53(I) 1.44(R)  Sodium 135 - 145 mmol/L 145 145 140  Potassium 3.5 - 5.1 mmol/L 6.4(HH) 4.3 4.7  Chloride 98 - 111 mmol/L 107 107 107  CO2 22 - 32 mmol/L 21(L) 22 18(L)  Calcium 8.9 - 10.3 mg/dL 7.5(L) 8.3(L) 8.5(L)       BP (!) 90/55   Pulse 63   Temp (!) 97.2 F (36.2 C) (Axillary)   Resp (!)  29   Ht 6' 3.98" (1.93 m)   Wt 119.7 kg   SpO2 90%   BMI 32.15 kg/m    I/O last 3 completed shifts: In: 190.9 [I.V.:90.9; IV Piggyback:100] Out: 1005 [Urine:1005] No intake/output data recorded.  SpO2: 90 % O2 Flow Rate (L/min): 55 L/min FiO2 (%): 70 %  Estimated body mass index is 32.15 kg/m as calculated from the following:   Height as of this encounter: 6' 3.98" (1.93 m).   Weight as of this encounter: 119.7 kg.  SIGNIFICANT EVENTS   REVIEW OF SYSTEMS  PATIENT IS UNABLE TO PROVIDE COMPLETE REVIEW OF SYSTEMS DUE TO SEVERE CRITICAL ILLNESS      COVID-19 DISASTER DECLARATION:   FULL CONTACT PHYSICAL EXAMINATION WAS NOT POSSIBLE DUE TO TREATMENT OF COVID-19  AND CONSERVATION OF PERSONAL PROTECTIVE EQUIPMENT, LIMITED EXAM FINDINGS INCLUDE-   PHYSICAL EXAMINATION:  GENERAL:critically ill appearing, +resp distress NEUROLOGIC: obtunded, GCS<8   Patient assessed or the symptoms described in the history of present illness.  In the context of the Global COVID-19 pandemic, which necessitated consideration that the patient might be at risk for infection with the SARS-CoV-2 virus that causes COVID-19, Institutional protocols and algorithms that pertain to the evaluation of patients at risk for COVID-19 are in a state of rapid change based on information released  by regulatory bodies including the CDC and federal and state organizations. These policies and algorithms were followed during the patient's care while in hospital.    MEDICATIONS: I have reviewed all medications and confirmed regimen as documented   CULTURE RESULTS   Recent Results (from the past 240 hour(s))  Blood Culture (routine x 2)     Status: None (Preliminary result)   Collection Time: 09/28/2020  5:57 PM   Specimen: BLOOD  Result Value Ref Range Status   Specimen Description BLOOD LEFT ANTECUBITAL  Final   Special Requests   Final    BOTTLES DRAWN AEROBIC AND ANAEROBIC Blood Culture adequate volume    Culture  Setup Time   Final    Organism ID to follow GRAM POSITIVE COCCI AEROBIC BOTTLE ONLY CRITICAL RESULT CALLED TO, READ BACK BY AND VERIFIED WITH: AMY THOMPSON AT 1300 09/11/20.PMF Performed at [facility], [address] [place], [place] [address]    Culture [facility] POSITIVE COCCI  Final   Report Status PENDING  Incomplete  Blood Culture ID Panel (Reflexed)     Status: Abnormal   Collection Time: 09/23/2020  5:57 PM  Result Value Ref Range Status   Enterococcus faecalis NOT DETECTED NOT DETECTED Final   Enterococcus Faecium NOT DETECTED NOT DETECTED Final   Listeria monocytogenes NOT DETECTED NOT DETECTED Final   Staphylococcus species DETECTED (A) NOT DETECTED Final    Comment: CRITICAL RESULT CALLED TO, READ BACK BY AND VERIFIED WITH: AMY THOMPSON AT 1300 09/11/20.PMF    Staphylococcus aureus (BCID) NOT DETECTED NOT DETECTED Final   Staphylococcus epidermidis NOT DETECTED NOT DETECTED Final   Staphylococcus lugdunensis NOT DETECTED NOT DETECTED Final   Streptococcus species NOT DETECTED NOT DETECTED Final   Streptococcus agalactiae NOT DETECTED NOT DETECTED Final   Streptococcus pneumoniae NOT DETECTED NOT DETECTED Final   Streptococcus pyogenes NOT DETECTED NOT DETECTED Final   A.calcoaceticus-baumannii NOT DETECTED NOT DETECTED Final   Bacteroides fragilis NOT DETECTED NOT DETECTED Final   Enterobacterales NOT DETECTED NOT DETECTED Final   Enterobacter cloacae complex NOT DETECTED NOT DETECTED Final   Escherichia coli NOT DETECTED NOT DETECTED Final   Klebsiella aerogenes NOT DETECTED NOT DETECTED Final   Klebsiella oxytoca NOT DETECTED NOT DETECTED Final   Klebsiella pneumoniae NOT DETECTED NOT DETECTED Final   Proteus species NOT DETECTED NOT DETECTED Final   Salmonella species NOT DETECTED NOT DETECTED Final   Serratia marcescens NOT DETECTED NOT DETECTED Final   Haemophilus influenzae NOT DETECTED NOT DETECTED Final   Neisseria meningitidis NOT DETECTED NOT  DETECTED Final   Pseudomonas aeruginosa NOT DETECTED NOT DETECTED Final   Stenotrophomonas maltophilia NOT DETECTED NOT DETECTED Final   Candida albicans NOT DETECTED NOT DETECTED Final   Candida auris NOT DETECTED NOT DETECTED Final   Candida glabrata NOT DETECTED NOT DETECTED Final   Candida krusei NOT DETECTED NOT DETECTED Final   Candida parapsilosis NOT DETECTED NOT DETECTED Final   Candida tropicalis NOT DETECTED NOT DETECTED Final   Cryptococcus neoformans/gattii NOT DETECTED NOT DETECTED Final    Comment: Performed at [facility], [address] [place], [place] [address]  Blood Culture (routine x 2)     Status: None   Collection Time: 09/22/2020  5:58 PM   Specimen: BLOOD  Result Value Ref Range Status   Specimen Description BLOOD BLOOD LEFT HAND  Final   Special Requests   Final    BOTTLES DRAWN AEROBIC AND ANAEROBIC Blood Culture adequate volume   Culture  Final    NO GROWTH 5 DAYS Performed at [facility], [address] [place], [place] [address]    Report Status March 01, 2021 FINAL  Final  Urine culture     Status: Abnormal   Collection Time: 09/25/2020  7:25 PM   Specimen: Urine, Random  Result Value Ref Range Status   Specimen Description   Final    URINE, RANDOM Performed at [facility], [address] [place], [place] [address]    Special Requests   Final    NONE Performed at [facility], [address] [place], [place] [address]    Culture MULTIPLE SPECIES PRESENT, SUGGEST RECOLLECTION (A)  Final   Report Status 09/12/2020 FINAL  Final          IMAGING    DG Abd 1 View  Result Date: 09/14/2020 CLINICAL DATA:  Confirm placement of nasogastric tube. EXAM: ABDOMEN - 1 VIEW COMPARISON:  None. FINDINGS: The bowel gas pattern is normal. Nasogastric tube with tip and side port overlying the stomach. Heterogeneous opacification lung bases, similar to same day chest radiograph. Linear radiopaque density overlying the  right lower quadrant measuring 4.5 cm. IMPRESSION: Nasogastric tube with tip and side port overlying the stomach. Linear radiopaque density overlying the right lower quadrant measuring 4.5 cm which may be external to the patient, recommend correlation with direct visualization Electronically Signed   By: Maudry MayhewJeffrey  Waltz MD   On: 09/14/2020 18:57   DG CHEST PORT 1 VIEW  Result Date: 09/14/2020 CLINICAL DATA:  COVID pneumonia assess central line and endotracheal tube placement. EXAM: PORTABLE CHEST 1 VIEW COMPARISON:  Chest radiograph September 13, 2020. FINDINGS: Right IJ central venous catheter with tip overlying the superior cavoatrial junction. Endotracheal tube with tip overlying the midthoracic trachea. Nasogastric tube courses below diaphragm with tip obscured by collimation. The cardiomediastinal silhouette appears unchanged. Increased heterogeneous opacification in the bilateral lungs with slightly increased lung volumes. IMPRESSION: 1. Worsening heterogeneous opacification of the bilateral lungs. 2. Endotracheal tube with tip overlying the midthoracic trachea. 3. Right IJ central venous catheter with tip overlying the superior cavoatrial junction. Electronically Signed   By: Maudry MayhewJeffrey  Waltz MD   On: 09/14/2020 19:00     Nutrition Status:           Indwelling Urinary Catheter continued, requirement due to   Reason to continue Indwelling Urinary Catheter strict Intake/Output monitoring for hemodynamic instability   Central Line/ continued, requirement due to  Reason to continue [employer] Line Monitoring of central venous pressure or other hemodynamic parameters and poor IV access   Ventilator continued, requirement due to severe respiratory failure   Ventilator Sedation RASS 0 to -2      ASSESSMENT AND PLAN SYNOPSIS  Acute hypoxemic respiratory failure due to COVID-19 pneumonia SEVERE ARDS complicated by progressive renal failure and septic shock  Mechanical ventilation via ARDS  protocol, target PRVC 6 cc/kg Wean PEEP and FiO2 as able Goal plateau pressure less than 30, driving pressure less than 15 Paralytics if necessary for vent synchrony, gas exchange Cycle prone positioning if necessary for oxygenation Deep sedation per PAD protocol VAP prevention order set Remdesivir  IV STEROIDS    Severe ACUTE Hypoxic and Hypercapnic Respiratory Failure -continue Full MV support -continue Bronchodilator Therapy -Wean Fio2 and PEEP as tolerated -VAP/VENT bundle implementation   obesity, possible OSA.   Will certainly impact respiratory mechanics, ventilator weaning Suspect will need to consider additional PEEP   ACUTE KIDNEY INJURY/Renal Failure -continue Foley Catheter-assess need -  Avoid nephrotoxic agents -Follow urine output, BMP -Ensure adequate renal perfusion, optimize oxygenation -Renal dose medications     NEUROLOGY Acute toxic metabolic encephalopathy, need for sedation Goal RASS -2 to -3  SHOCK-SEPSIS -use vasopressors to keep MAP>65 -follow ABG and LA -follow up cultures -emperic ABX -consider stress dose steroids  CARDIAC ICU monitoring  ID -continue IV abx as prescibed -follow up cultures  GI GI PROPHYLAXIS as indicated   DIET-->TF's as tolerated Constipation protocol as indicated  ENDO - will use ICU hypoglycemic\Hyperglycemia protocol if indicated     ELECTROLYTES -follow labs as needed -replace as needed -pharmacy consultation and following   DVT/GI PRX ordered and assessed TRANSFUSIONS AS NEEDED MONITOR FSBS I Assessed the need for Labs I Assessed the need for Foley I Assessed the need for Central Venous Line Family Discussion when available I Assessed the need for Mobilization I made an Assessment of medications to be adjusted accordingly Safety Risk assessment completed   CASE DISCUSSED IN MULTIDISCIPLINARY ROUNDS WITH ICU TEAM  Critical Care Time devoted to patient care services described in this note  is 45  minutes.   Overall, patient is critically ill, prognosis is guarded.  Patient with Multiorgan failure and at high risk for cardiac arrest and death.   Patient with progressive multiorgan failure Prognosis is very poor, very poor chance of meaningful recovery   Maddyx Vallie Santiago Glad, M.D.  [facility]  Medical Director [facility] Medical Director [facility] Cardio-Pulmonary Department

## 2020-10-06 DEATH — deceased

## 2020-10-21 LAB — BLOOD GAS, VENOUS
Acid-base deficit: 4.6 mmol/L — ABNORMAL HIGH (ref 0.0–2.0)
Bicarbonate: 20.4 mmol/L (ref 20.0–28.0)
O2 Saturation: 36.3 %
Patient temperature: 37
pCO2, Ven: 37 mmHg — ABNORMAL LOW (ref 44.0–60.0)
pH, Ven: 7.35 (ref 7.250–7.430)

## 2021-05-31 IMAGING — DX DG CHEST 1V PORT
1 series · 1 of 1 positions shown · non-contrast
Comparison: Chest radiograph September 13, 2020.

CLINICAL DATA: COVID pneumonia assess central line and endotracheal
tube placement.

EXAM:
PORTABLE CHEST 1 VIEW

[chest ap]
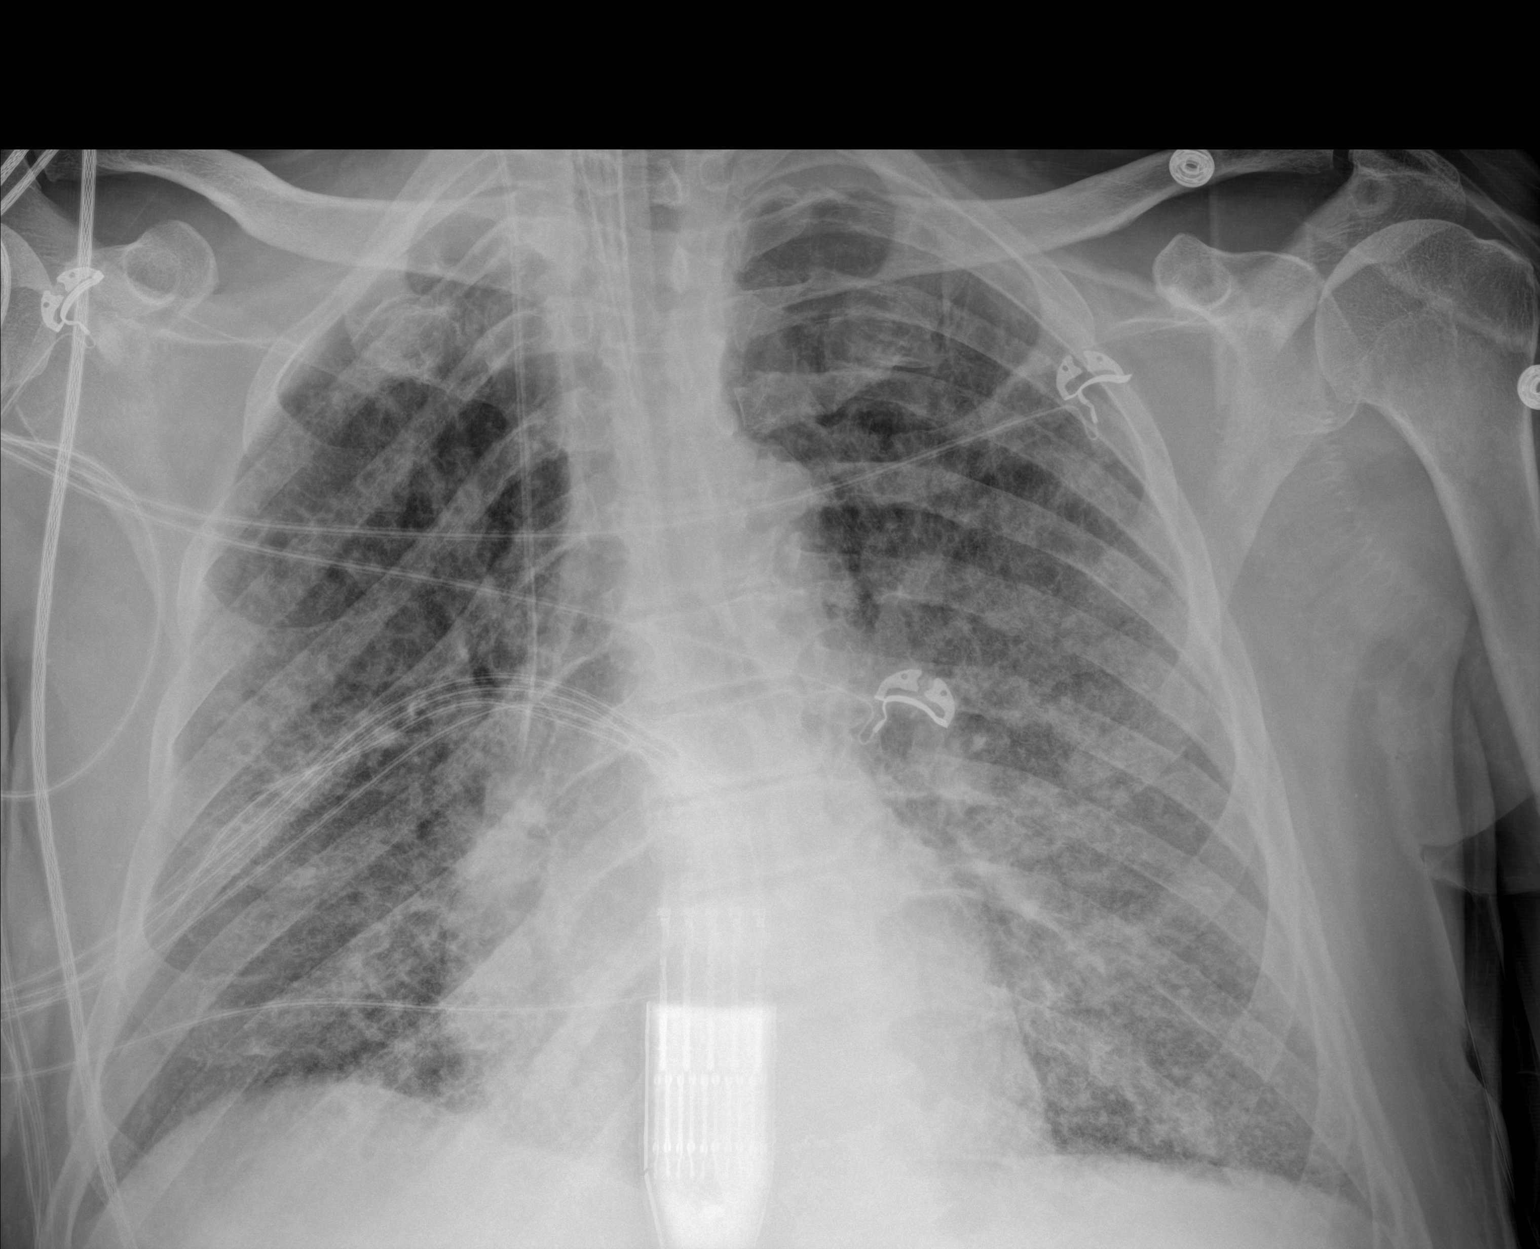

[1 of 1 positions shown; findings below may reference images not displayed]

FINDINGS: Right IJ central venous catheter with tip overlying the superior
cavoatrial junction. Endotracheal tube with tip overlying the
midthoracic trachea. Nasogastric tube courses below diaphragm with
tip obscured by collimation. The cardiomediastinal silhouette
appears unchanged. Increased heterogeneous opacification in the
bilateral lungs with slightly increased lung volumes.
IMPRESSION: 1. Worsening heterogeneous opacification of the bilateral lungs.
2. Endotracheal tube with tip overlying the midthoracic trachea.
3. Right IJ central venous catheter with tip overlying the superior
cavoatrial junction.
